# Patient Record
Sex: Female | Born: 1950
Health system: Southern US, Community
[De-identification: ages and names within clinical notes are randomized; demographics above are authoritative.]

## PROBLEM LIST (undated history)

## (undated) DIAGNOSIS — M8580 Other specified disorders of bone density and structure, unspecified site: Secondary | ICD-10-CM

## (undated) DIAGNOSIS — N393 Stress incontinence (female) (male): Secondary | ICD-10-CM

## (undated) DIAGNOSIS — Z973 Presence of spectacles and contact lenses: Secondary | ICD-10-CM

## (undated) DIAGNOSIS — L309 Dermatitis, unspecified: Secondary | ICD-10-CM

## (undated) DIAGNOSIS — N8110 Cystocele, unspecified: Secondary | ICD-10-CM

## (undated) DIAGNOSIS — K219 Gastro-esophageal reflux disease without esophagitis: Secondary | ICD-10-CM

## (undated) DIAGNOSIS — L719 Rosacea, unspecified: Secondary | ICD-10-CM

## (undated) DIAGNOSIS — E079 Disorder of thyroid, unspecified: Secondary | ICD-10-CM

## (undated) DIAGNOSIS — E039 Hypothyroidism, unspecified: Secondary | ICD-10-CM

## (undated) DIAGNOSIS — R413 Other amnesia: Secondary | ICD-10-CM

## (undated) DIAGNOSIS — E7800 Pure hypercholesterolemia, unspecified: Secondary | ICD-10-CM

## (undated) DIAGNOSIS — Z72820 Sleep deprivation: Secondary | ICD-10-CM

## (undated) DIAGNOSIS — Z8719 Personal history of other diseases of the digestive system: Secondary | ICD-10-CM

## (undated) DIAGNOSIS — F419 Anxiety disorder, unspecified: Secondary | ICD-10-CM

## (undated) DIAGNOSIS — T7840XA Allergy, unspecified, initial encounter: Secondary | ICD-10-CM

## (undated) DIAGNOSIS — G709 Myoneural disorder, unspecified: Secondary | ICD-10-CM

## (undated) DIAGNOSIS — H269 Unspecified cataract: Secondary | ICD-10-CM

## (undated) DIAGNOSIS — U071 COVID-19: Secondary | ICD-10-CM

## (undated) HISTORY — DX: Pure hypercholesterolemia, unspecified: E78.00

## (undated) HISTORY — DX: Sleep deprivation: Z72.820

## (undated) HISTORY — DX: Disorder of thyroid, unspecified: E07.9

## (undated) HISTORY — PX: BLADDER SUSPENSION: SHX72

## (undated) HISTORY — DX: Anxiety disorder, unspecified: F41.9

## (undated) HISTORY — DX: Other specified disorders of bone density and structure, unspecified site: M85.80

## (undated) HISTORY — DX: Allergy, unspecified, initial encounter: T78.40XA

## (undated) HISTORY — PX: TONSILLECTOMY: SUR1361

## (undated) HISTORY — DX: Gastro-esophageal reflux disease without esophagitis: K21.9

## (undated) HISTORY — PX: VAGINAL HYSTERECTOMY: SUR661

## (undated) HISTORY — PX: NECK SURGERY: SHX720

## (undated) HISTORY — DX: Other amnesia: R41.3

## (undated) HISTORY — PX: OTHER SURGICAL HISTORY: SHX169

## (undated) HISTORY — PX: ABDOMINAL HYSTERECTOMY: SHX81

## (undated) HISTORY — PX: OOPHORECTOMY: SHX86

## (undated) HISTORY — DX: Unspecified cataract: H26.9

## (undated) HISTORY — PX: CATARACT EXTRACTION: SUR2

## (undated) SURGERY — Surgical Case
Anesthesia: *Unknown

---
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
## 1998-06-11 ENCOUNTER — Ambulatory Visit (HOSPITAL_COMMUNITY): Admission: RE | Admit: 1998-06-11 | Discharge: 1998-06-11 | Payer: Self-pay | Admitting: Obstetrics and Gynecology

## 2000-04-03 ENCOUNTER — Encounter: Payer: Self-pay | Admitting: Emergency Medicine

## 2000-04-03 ENCOUNTER — Ambulatory Visit (HOSPITAL_COMMUNITY): Admission: RE | Admit: 2000-04-03 | Discharge: 2000-04-03 | Payer: Self-pay | Admitting: Emergency Medicine

## 2001-05-01 ENCOUNTER — Ambulatory Visit (HOSPITAL_COMMUNITY): Admission: RE | Admit: 2001-05-01 | Discharge: 2001-05-01 | Payer: Self-pay | Admitting: Emergency Medicine

## 2001-09-11 ENCOUNTER — Encounter: Admission: RE | Admit: 2001-09-11 | Discharge: 2001-09-11 | Payer: Self-pay | Admitting: Emergency Medicine

## 2001-09-11 ENCOUNTER — Encounter: Payer: Self-pay | Admitting: Emergency Medicine

## 2002-03-07 ENCOUNTER — Encounter: Payer: Self-pay | Admitting: Emergency Medicine

## 2002-03-07 ENCOUNTER — Ambulatory Visit (HOSPITAL_COMMUNITY): Admission: RE | Admit: 2002-03-07 | Discharge: 2002-03-07 | Payer: Self-pay | Admitting: Emergency Medicine

## 2002-03-28 ENCOUNTER — Ambulatory Visit (HOSPITAL_COMMUNITY): Admission: RE | Admit: 2002-03-28 | Discharge: 2002-03-28 | Payer: Self-pay | Admitting: Emergency Medicine

## 2002-03-28 ENCOUNTER — Encounter: Payer: Self-pay | Admitting: Emergency Medicine

## 2002-04-11 ENCOUNTER — Encounter: Payer: Self-pay | Admitting: Emergency Medicine

## 2002-04-11 ENCOUNTER — Encounter: Admission: RE | Admit: 2002-04-11 | Discharge: 2002-04-11 | Payer: Self-pay | Admitting: Emergency Medicine

## 2003-10-07 ENCOUNTER — Emergency Department (HOSPITAL_COMMUNITY): Admission: EM | Admit: 2003-10-07 | Discharge: 2003-10-07 | Payer: Self-pay | Admitting: Emergency Medicine

## 2003-11-12 ENCOUNTER — Encounter: Admission: RE | Admit: 2003-11-12 | Discharge: 2003-11-12 | Payer: Self-pay | Admitting: Emergency Medicine

## 2003-11-23 ENCOUNTER — Encounter: Admission: RE | Admit: 2003-11-23 | Discharge: 2003-11-23 | Payer: Self-pay | Admitting: Neurosurgery

## 2003-12-14 ENCOUNTER — Ambulatory Visit (HOSPITAL_COMMUNITY): Admission: RE | Admit: 2003-12-14 | Discharge: 2003-12-14 | Payer: Self-pay | Admitting: Gastroenterology

## 2003-12-14 ENCOUNTER — Encounter (INDEPENDENT_AMBULATORY_CARE_PROVIDER_SITE_OTHER): Payer: Self-pay | Admitting: Specialist

## 2005-04-26 ENCOUNTER — Encounter: Admission: RE | Admit: 2005-04-26 | Discharge: 2005-04-26 | Payer: Self-pay | Admitting: Emergency Medicine

## 2005-05-15 ENCOUNTER — Encounter: Admission: RE | Admit: 2005-05-15 | Discharge: 2005-05-15 | Payer: Self-pay | Admitting: Emergency Medicine

## 2006-08-10 ENCOUNTER — Encounter: Admission: RE | Admit: 2006-08-10 | Discharge: 2006-08-10 | Payer: Self-pay | Admitting: Emergency Medicine

## 2007-10-25 ENCOUNTER — Encounter: Admission: RE | Admit: 2007-10-25 | Discharge: 2007-10-25 | Payer: Self-pay | Admitting: Emergency Medicine

## 2008-01-17 ENCOUNTER — Ambulatory Visit (HOSPITAL_COMMUNITY): Admission: RE | Admit: 2008-01-17 | Discharge: 2008-01-17 | Payer: Self-pay | Admitting: Internal Medicine

## 2008-01-17 ENCOUNTER — Ambulatory Visit: Payer: Self-pay | Admitting: Surgery

## 2008-01-17 ENCOUNTER — Encounter (INDEPENDENT_AMBULATORY_CARE_PROVIDER_SITE_OTHER): Payer: Self-pay | Admitting: Internal Medicine

## 2008-03-13 ENCOUNTER — Encounter (INDEPENDENT_AMBULATORY_CARE_PROVIDER_SITE_OTHER): Payer: Self-pay | Admitting: *Deleted

## 2008-03-13 ENCOUNTER — Ambulatory Visit (HOSPITAL_COMMUNITY): Admission: RE | Admit: 2008-03-13 | Discharge: 2008-03-13 | Payer: Self-pay | Admitting: *Deleted

## 2008-10-16 ENCOUNTER — Ambulatory Visit (HOSPITAL_COMMUNITY): Admission: RE | Admit: 2008-10-16 | Discharge: 2008-10-16 | Payer: Self-pay | Admitting: *Deleted

## 2008-11-02 ENCOUNTER — Encounter: Admission: RE | Admit: 2008-11-02 | Discharge: 2008-11-02 | Payer: Self-pay | Admitting: Internal Medicine

## 2009-03-29 ENCOUNTER — Ambulatory Visit (HOSPITAL_COMMUNITY): Admission: RE | Admit: 2009-03-29 | Discharge: 2009-03-29 | Payer: Self-pay | Admitting: *Deleted

## 2009-06-29 ENCOUNTER — Ambulatory Visit (HOSPITAL_COMMUNITY): Admission: RE | Admit: 2009-06-29 | Discharge: 2009-06-29 | Payer: Self-pay | Admitting: Internal Medicine

## 2009-06-29 ENCOUNTER — Ambulatory Visit: Payer: Self-pay | Admitting: Vascular Surgery

## 2009-06-29 ENCOUNTER — Encounter (INDEPENDENT_AMBULATORY_CARE_PROVIDER_SITE_OTHER): Payer: Self-pay | Admitting: Internal Medicine

## 2010-01-20 ENCOUNTER — Emergency Department (HOSPITAL_COMMUNITY): Admission: EM | Admit: 2010-01-20 | Discharge: 2010-01-20 | Payer: Self-pay | Admitting: Emergency Medicine

## 2010-01-24 ENCOUNTER — Encounter: Admission: RE | Admit: 2010-01-24 | Discharge: 2010-01-24 | Payer: Self-pay | Admitting: Neurosurgery

## 2010-02-10 ENCOUNTER — Encounter: Admission: RE | Admit: 2010-02-10 | Discharge: 2010-02-10 | Payer: Self-pay | Admitting: Neurosurgery

## 2010-02-22 ENCOUNTER — Encounter: Admission: RE | Admit: 2010-02-22 | Discharge: 2010-02-22 | Payer: Self-pay | Admitting: Internal Medicine

## 2010-05-04 ENCOUNTER — Encounter: Admission: RE | Admit: 2010-05-04 | Discharge: 2010-05-04 | Payer: Self-pay | Admitting: Neurosurgery

## 2010-07-05 ENCOUNTER — Ambulatory Visit (HOSPITAL_COMMUNITY): Admission: RE | Admit: 2010-07-05 | Discharge: 2010-07-05 | Payer: Self-pay | Admitting: Neurosurgery

## 2010-07-19 ENCOUNTER — Encounter: Admission: RE | Admit: 2010-07-19 | Discharge: 2010-07-19 | Payer: Self-pay | Admitting: Neurosurgery

## 2010-08-08 ENCOUNTER — Ambulatory Visit (HOSPITAL_COMMUNITY): Admission: RE | Admit: 2010-08-08 | Discharge: 2010-08-08 | Payer: Self-pay | Admitting: Neurosurgery

## 2010-09-05 ENCOUNTER — Encounter: Admission: RE | Admit: 2010-09-05 | Discharge: 2010-09-14 | Payer: Self-pay | Admitting: Neurosurgery

## 2010-10-03 ENCOUNTER — Inpatient Hospital Stay (HOSPITAL_COMMUNITY)
Admission: RE | Admit: 2010-10-03 | Discharge: 2010-10-04 | Payer: Self-pay | Source: Home / Self Care | Admitting: Neurosurgery

## 2010-10-28 ENCOUNTER — Ambulatory Visit (HOSPITAL_COMMUNITY)
Admission: RE | Admit: 2010-10-28 | Discharge: 2010-10-28 | Payer: Self-pay | Source: Home / Self Care | Attending: Neurosurgery | Admitting: Neurosurgery

## 2010-11-27 ENCOUNTER — Encounter: Payer: Self-pay | Admitting: Internal Medicine

## 2010-12-27 ENCOUNTER — Ambulatory Visit (HOSPITAL_COMMUNITY)
Admission: RE | Admit: 2010-12-27 | Discharge: 2010-12-27 | Disposition: A | Discharge status: Home / Self Care | Payer: 59 | Source: Ambulatory Visit | Attending: Neurosurgery | Admitting: Neurosurgery

## 2010-12-27 ENCOUNTER — Other Ambulatory Visit (HOSPITAL_COMMUNITY): Payer: Self-pay | Admitting: Neurosurgery

## 2010-12-27 DIAGNOSIS — Z981 Arthrodesis status: Secondary | ICD-10-CM | POA: Insufficient documentation

## 2010-12-27 DIAGNOSIS — R52 Pain, unspecified: Secondary | ICD-10-CM

## 2010-12-27 DIAGNOSIS — M542 Cervicalgia: Secondary | ICD-10-CM | POA: Insufficient documentation

## 2011-01-17 LAB — CBC
HCT: 41.4 % (ref 36.0–46.0)
Hemoglobin: 13.7 g/dL (ref 12.0–15.0)
MCH: 30 pg (ref 26.0–34.0)
MCHC: 33.1 g/dL (ref 30.0–36.0)
MCV: 90.6 fL (ref 78.0–100.0)
Platelets: 285 10*3/uL (ref 150–400)
RBC: 4.57 MIL/uL (ref 3.87–5.11)
RDW: 13 % (ref 11.5–15.5)
WBC: 6.7 10*3/uL (ref 4.0–10.5)

## 2011-01-17 LAB — SURGICAL PCR SCREEN
MRSA, PCR: NEGATIVE
Staphylococcus aureus: NEGATIVE

## 2011-03-21 NOTE — Op Note (Signed)
Anne Alexander, Anne Alexander            ACCOUNT NO.:  [id number]   MEDICAL RECORD NO.:  [id number]          PATIENT TYPE:  AMB   LOCATION:  ENDO                         FACILITY:  [facility]   PHYSICIAN:  Georgiana Spinner, M.D.    DATE OF BIRTH:  06-13-1951   DATE OF PROCEDURE:  DATE OF DISCHARGE:                               OPERATIVE REPORT   PROCEDURE:  Upper endoscopy with biopsy.   INDICATIONS:  H. pylori gastritis.   ANESTHESIA:  Fentanyl 50 mcg, Versed 5 mg.   PROCEDURE:  With the patient mildly sedated in the left lateral  decubitus position, the Pentax videoscopic endoscope was inserted in the  mouth, passed under direct vision through the esophagus, which appeared  normal, into the stomach fundus, body, antrum appeared normal.  The  duodenal bulb showed some mild erythema, very patchy.  The second  portion of the duodenum appeared normal.  From this point the endoscope  was slowly withdrawn, taking circumferential views of the duodenal  mucosa until the endoscope had been pulled back into the stomach, placed  in retroflexion to view the stomach from below.  The endoscope was then  straightened and biopsies were taken from the antrum and fundus, placed  on an H. pylori rapid test called a CLOtest, and another biopsy was  taken separately to be sent for pathology as necessary.  The endoscope  was then withdrawn, taking circumferential views of the remaining  gastric and esophageal mucosa.  The patient's vital signs, pulse  oximeter remained stable.  The patient tolerated the procedure well  without apparent complication.   FINDINGS:  Essentially normal examination with mild erythema of the  duodenal bulb.   PLAN:  Await CLO biopsy report.  If negative, will not send the biopsied  tissue for culture           ______________________________  Georgiana Spinner, M.D.     GMO/MEDQ  D:  10/16/2008  T:  10/16/2008  Job:  [id number]

## 2011-03-21 NOTE — Op Note (Signed)
NAMELILLIS, NUTTLE            ACCOUNT NO.:  [id number]   MEDICAL RECORD NO.:  [id number]          PATIENT TYPE:  AMB   LOCATION:  ENDO                         FACILITY:  [facility]   PHYSICIAN:  Georgiana Spinner, M.D.    DATE OF BIRTH:  07/21/1951   DATE OF PROCEDURE:  03/29/2009  DATE OF DISCHARGE:                               OPERATIVE REPORT   PROCEDURE:  Colonoscopy.   INDICATIONS:  Colon polyps.   ANESTHESIA:  Fentanyl 75 mcg, Versed 7 mg.   PROCEDURE:  With the patient mildly sedated in the left lateral  decubitus position, the Pentax videoscopic colonoscope was inserted into  the rectum and passed under direct vision to the cecum identified by  ileocecal valve and appendiceal orifice both of which were photographed.  From this point the colonoscope was slowly withdrawn taking  circumferential views of colonic mucosa stopping to photograph  diverticula all the way until we reached the rectum which appeared  normal on direct and showed hemorrhoids on retroflexed view.  The  endoscope was straightened and withdrawn.  The patient's vital signs,  pulse oximeter remained stable.  The patient tolerated procedure well  without apparent complications.   FINDINGS:  Diverticulosis of sigmoid colon moderately severe internal  hemorrhoids and of note, lipoma in the ascending colon near this hepatic  flexure, photographed only.   PLAN:  Have patient follow-up with me in 5 years or as needed.           ______________________________  Georgiana Spinner, M.D.     GMO/MEDQ  D:  03/29/2009  T:  03/29/2009  Job:  [id number]

## 2011-03-21 NOTE — Op Note (Signed)
Anne Alexander, Anne Alexander            ACCOUNT NO.:  [id number]   MEDICAL RECORD NO.:  [id number]          PATIENT TYPE:  AMB   LOCATION:  ENDO                         FACILITY:  [facility]   PHYSICIAN:  Georgiana Spinner, M.D.    DATE OF BIRTH:  Aug 13, 1951   DATE OF PROCEDURE:  03/13/2008  DATE OF DISCHARGE:                               OPERATIVE REPORT   PROCEDURE:  Upper endoscopy.   INDICATIONS:  Upper gastrointestinal symptoms.   ANESTHESIA:  1. Fentanyl 25 mcg.  2. Versed 2.5 mg.   PROCEDURE:  With the patient mildly sedated in the left lateral  decubitus position, the Pentax videoscopic endoscope was inserted in the  mouth and passed under direct vision through the esophagus, which  appeared normal.  There was no evidence of esophagitis or Barrett's  seen.  We entered into the stomach.  Fundus, body, and antrum appeared  clear.  Duodenal bulb showed some minor changes of duodenitis which were  photographed and biopsied.  Second portion of the duodenum appeared  normal.  From this point, the endoscope was slowly withdrawn, taking  circumferential views of the duodenal mucosa, until the endoscope had  been pulled back into the stomach and placed in retroflexion to view the  stomach from below.  The endoscope was straightened and withdrawn,  taking circumferential views of the remaining gastric and esophageal  mucosa.  The patient's vital signs and pulse oximeter remained stable.  The patient tolerated the procedure well, without apparent  complications.   FINDINGS:  Mild duodenitis and incomplete wrap of the GE junction around  the endoscope, indicating laxity of the lower esophageal sphincter.   PLAN:  Await biopsy report.  The patient will call me for results and  follow up with me as an outpatient.  Of note, we also did a biopsy of  the antrum and body of the stomach, even though they appeared normal, to  rule out occult Helicobacter pylori.     ______________________________  Georgiana Spinner, M.D.     GMO/MEDQ  D:  03/13/2008  T:  03/13/2008  Job:  [id number]

## 2011-03-24 NOTE — Op Note (Signed)
NAME:  Anne Alexander, Anne Alexander                      ACCOUNT NO.:  [id number]   MEDICAL RECORD NO.:  [id number]                   PATIENT TYPE:  AMB   LOCATION:  ENDO                                 [facility]ACILITY:  [facility]   PHYSICIAN:  Danise Edge, M.D.                DATE OF BIRTH:  December 08, 1950   DATE OF PROCEDURE:  12/14/2003  DATE OF DISCHARGE:                                 OPERATIVE REPORT   PROCEDURE:  Colonoscopy and polypectomy.   INDICATIONS:  Anne Alexander is a 60 year old female, born Mar 31, 1951.  Anne Alexander has chronic functional type constipation.  She  underwent a flexible proctosigmoidoscopy performed by Dr. Leslee Home and a  small polyp was present at 20 cm from the anal verge.   ENDOSCOPIST:  Danise Edge, M.D.   PREMEDICATION:  Versed 7.5 mg, Demerol 50 mg.   DESCRIPTION OF PROCEDURE:  After obtaining informed consent, Anne Alexander  was placed in the left lateral decubitus position.  I administered  intravenous Demerol and intravenous Versed to achieve conscious sedation for  the procedure.  The patient's blood pressure, oxygen saturation and cardiac  rhythm were monitored throughout the procedure and documented in the medical  record.   Anal inspection was normal.  Digital rectal exam was normal.  The Olympus  adjustable pediatric colonoscope was introduced into the rectum and advanced  to the cecum.  Colonic preparation for the exam today was excellent.  Mrs.  Anne Alexander received a combination of MiraLax mixed in [place] Ade.  Rectum:  Normal.  Sigmoid colon and descending colon:  At 20 cm from the anal verge, two 2 mm  sessile polyps were removed with the electrocautery snare and submitted for  pathologic interpretation.  Extensive left colonic diverticulosis.  Splenic flexure:  Normal.  Transverse colon:  Normal.  Hepatic flexure:  Normal.  Ascending colon:  Normal.  Cecum and ileocecal valve:  Normal.   ASSESSMENT:  1. From the distal sigmoid  colon, two small polyps were removed with the     electrocautery snare.  2. Extensive left colonic diverticulosis.   RECOMMENDATIONS:  Repeat colonoscopy in five years if polyps return  neoplastic pathologically.                                               Danise Edge, M.D.    MJ/MEDQ  D:  12/14/2003  T:  12/14/2003  Job:  [id number]   cc:   Reuben Likes, M.D.  [address]  [place]  [place] [address]  Fax: [phone]

## 2011-04-27 ENCOUNTER — Ambulatory Visit (HOSPITAL_COMMUNITY)
Admission: RE | Admit: 2011-04-27 | Discharge: 2011-04-27 | Disposition: A | Discharge status: Home / Self Care | Payer: 59 | Source: Ambulatory Visit | Attending: Neurosurgery | Admitting: Neurosurgery

## 2011-04-27 ENCOUNTER — Other Ambulatory Visit (HOSPITAL_COMMUNITY): Payer: Self-pay | Admitting: Neurosurgery

## 2011-04-27 DIAGNOSIS — M542 Cervicalgia: Secondary | ICD-10-CM | POA: Insufficient documentation

## 2011-04-27 DIAGNOSIS — R52 Pain, unspecified: Secondary | ICD-10-CM

## 2011-07-13 ENCOUNTER — Ambulatory Visit (HOSPITAL_COMMUNITY)
Admission: RE | Admit: 2011-07-13 | Discharge: 2011-07-13 | Disposition: A | Discharge status: Home / Self Care | Payer: 59 | Source: Ambulatory Visit | Attending: Neurosurgery | Admitting: Neurosurgery

## 2011-07-13 ENCOUNTER — Other Ambulatory Visit (HOSPITAL_COMMUNITY): Payer: Self-pay | Admitting: Neurosurgery

## 2011-07-13 DIAGNOSIS — R52 Pain, unspecified: Secondary | ICD-10-CM

## 2011-07-13 DIAGNOSIS — M542 Cervicalgia: Secondary | ICD-10-CM | POA: Insufficient documentation

## 2011-07-13 DIAGNOSIS — M2569 Stiffness of other specified joint, not elsewhere classified: Secondary | ICD-10-CM | POA: Insufficient documentation

## 2011-08-11 LAB — MISCELLANEOUS TEST

## 2011-08-11 LAB — CLOTEST (H. PYLORI), BIOPSY: Helicobacter screen: POSITIVE — AB

## 2011-10-03 ENCOUNTER — Other Ambulatory Visit (HOSPITAL_COMMUNITY): Payer: Self-pay | Admitting: Neurosurgery

## 2011-10-03 ENCOUNTER — Ambulatory Visit (HOSPITAL_COMMUNITY)
Admission: RE | Admit: 2011-10-03 | Discharge: 2011-10-03 | Disposition: A | Discharge status: Home / Self Care | Payer: 59 | Source: Ambulatory Visit | Attending: Neurosurgery | Admitting: Neurosurgery

## 2011-10-03 DIAGNOSIS — M542 Cervicalgia: Secondary | ICD-10-CM | POA: Insufficient documentation

## 2011-10-03 DIAGNOSIS — R52 Pain, unspecified: Secondary | ICD-10-CM

## 2011-10-10 ENCOUNTER — Other Ambulatory Visit: Payer: Self-pay | Admitting: Neurosurgery

## 2011-10-10 DIAGNOSIS — M549 Dorsalgia, unspecified: Secondary | ICD-10-CM

## 2011-10-12 ENCOUNTER — Ambulatory Visit
Admission: RE | Admit: 2011-10-12 | Discharge: 2011-10-12 | Disposition: A | Discharge status: Home / Self Care | Payer: 59 | Source: Ambulatory Visit | Attending: Neurosurgery | Admitting: Neurosurgery

## 2011-10-12 DIAGNOSIS — M549 Dorsalgia, unspecified: Secondary | ICD-10-CM

## 2011-10-12 MED ORDER — IOHEXOL 180 MG/ML  SOLN
1.0000 mL | Freq: Once | INTRAMUSCULAR | Status: AC | PRN
  Administered 2011-10-12: 1 mL via EPIDURAL

## 2011-10-12 MED ORDER — METHYLPREDNISOLONE ACETATE 40 MG/ML INJ SUSP (RADIOLOG
120.0000 mg | Freq: Once | INTRAMUSCULAR | Status: AC
  Administered 2011-10-12: 120 mg via EPIDURAL

## 2011-11-08 ENCOUNTER — Other Ambulatory Visit: Payer: Self-pay | Admitting: Internal Medicine

## 2011-11-08 DIAGNOSIS — Z1231 Encounter for screening mammogram for malignant neoplasm of breast: Secondary | ICD-10-CM

## 2011-12-12 ENCOUNTER — Ambulatory Visit
Admission: RE | Admit: 2011-12-12 | Discharge: 2011-12-12 | Disposition: A | Discharge status: Home / Self Care | Payer: 59 | Source: Ambulatory Visit | Attending: Internal Medicine | Admitting: Internal Medicine

## 2011-12-12 DIAGNOSIS — Z1231 Encounter for screening mammogram for malignant neoplasm of breast: Secondary | ICD-10-CM

## 2012-11-21 ENCOUNTER — Other Ambulatory Visit: Payer: Self-pay | Admitting: Internal Medicine

## 2012-11-21 DIAGNOSIS — Z1231 Encounter for screening mammogram for malignant neoplasm of breast: Secondary | ICD-10-CM

## 2013-01-14 ENCOUNTER — Ambulatory Visit
Admission: RE | Admit: 2013-01-14 | Discharge: 2013-01-14 | Disposition: A | Discharge status: Home / Self Care | Payer: 59 | Source: Ambulatory Visit | Attending: Internal Medicine | Admitting: Internal Medicine

## 2013-01-14 ENCOUNTER — Other Ambulatory Visit: Payer: Self-pay | Admitting: Internal Medicine

## 2013-01-14 ENCOUNTER — Ambulatory Visit: Payer: 59

## 2013-02-17 ENCOUNTER — Other Ambulatory Visit (HOSPITAL_COMMUNITY): Payer: Self-pay | Admitting: Neurosurgery

## 2013-02-17 ENCOUNTER — Ambulatory Visit (HOSPITAL_COMMUNITY)
Admission: RE | Admit: 2013-02-17 | Discharge: 2013-02-17 | Disposition: A | Discharge status: Home / Self Care | Payer: 59 | Source: Ambulatory Visit | Attending: Neurosurgery | Admitting: Neurosurgery

## 2013-02-17 DIAGNOSIS — M51379 Other intervertebral disc degeneration, lumbosacral region without mention of lumbar back pain or lower extremity pain: Secondary | ICD-10-CM | POA: Insufficient documentation

## 2013-02-17 DIAGNOSIS — M5137 Other intervertebral disc degeneration, lumbosacral region: Secondary | ICD-10-CM | POA: Insufficient documentation

## 2013-02-17 DIAGNOSIS — M5126 Other intervertebral disc displacement, lumbar region: Secondary | ICD-10-CM

## 2013-02-17 DIAGNOSIS — M542 Cervicalgia: Secondary | ICD-10-CM | POA: Insufficient documentation

## 2013-02-20 ENCOUNTER — Other Ambulatory Visit (HOSPITAL_COMMUNITY): Payer: Self-pay | Admitting: Neurosurgery

## 2013-02-20 DIAGNOSIS — IMO0002 Reserved for concepts with insufficient information to code with codable children: Secondary | ICD-10-CM

## 2013-02-20 DIAGNOSIS — M542 Cervicalgia: Secondary | ICD-10-CM

## 2013-02-26 ENCOUNTER — Ambulatory Visit (HOSPITAL_COMMUNITY)
Admission: RE | Admit: 2013-02-26 | Discharge: 2013-02-26 | Disposition: A | Discharge status: Home / Self Care | Payer: 59 | Source: Ambulatory Visit | Attending: Neurosurgery | Admitting: Neurosurgery

## 2013-02-26 DIAGNOSIS — M47817 Spondylosis without myelopathy or radiculopathy, lumbosacral region: Secondary | ICD-10-CM | POA: Insufficient documentation

## 2013-02-26 DIAGNOSIS — M5126 Other intervertebral disc displacement, lumbar region: Secondary | ICD-10-CM | POA: Insufficient documentation

## 2013-02-26 DIAGNOSIS — IMO0002 Reserved for concepts with insufficient information to code with codable children: Secondary | ICD-10-CM

## 2013-02-26 DIAGNOSIS — M5030 Other cervical disc degeneration, unspecified cervical region: Secondary | ICD-10-CM | POA: Insufficient documentation

## 2013-02-26 DIAGNOSIS — E049 Nontoxic goiter, unspecified: Secondary | ICD-10-CM | POA: Insufficient documentation

## 2013-02-26 DIAGNOSIS — M542 Cervicalgia: Secondary | ICD-10-CM

## 2013-02-26 DIAGNOSIS — M5020 Other cervical disc displacement, unspecified cervical region: Secondary | ICD-10-CM | POA: Insufficient documentation

## 2013-03-17 ENCOUNTER — Other Ambulatory Visit: Payer: Self-pay | Admitting: Internal Medicine

## 2013-03-17 DIAGNOSIS — E041 Nontoxic single thyroid nodule: Secondary | ICD-10-CM

## 2013-03-18 ENCOUNTER — Other Ambulatory Visit: Payer: Self-pay | Admitting: Neurosurgery

## 2013-03-18 DIAGNOSIS — M549 Dorsalgia, unspecified: Secondary | ICD-10-CM

## 2013-03-24 ENCOUNTER — Ambulatory Visit
Admission: RE | Admit: 2013-03-24 | Discharge: 2013-03-24 | Disposition: A | Discharge status: Home / Self Care | Payer: 59 | Source: Ambulatory Visit | Attending: Neurosurgery | Admitting: Neurosurgery

## 2013-03-24 ENCOUNTER — Other Ambulatory Visit: Payer: Self-pay | Admitting: Neurosurgery

## 2013-03-24 ENCOUNTER — Ambulatory Visit
Admission: RE | Admit: 2013-03-24 | Discharge: 2013-03-24 | Disposition: A | Discharge status: Home / Self Care | Payer: 59 | Source: Ambulatory Visit | Attending: Internal Medicine | Admitting: Internal Medicine

## 2013-03-24 DIAGNOSIS — M549 Dorsalgia, unspecified: Secondary | ICD-10-CM

## 2013-03-24 DIAGNOSIS — E041 Nontoxic single thyroid nodule: Secondary | ICD-10-CM

## 2013-03-24 MED ORDER — IOHEXOL 180 MG/ML  SOLN
1.0000 mL | Freq: Once | INTRAMUSCULAR | Status: AC | PRN
  Administered 2013-03-24: 1 mL via EPIDURAL

## 2013-03-24 MED ORDER — METHYLPREDNISOLONE ACETATE 40 MG/ML INJ SUSP (RADIOLOG
120.0000 mg | Freq: Once | INTRAMUSCULAR | Status: AC
  Administered 2013-03-24: 120 mg via EPIDURAL

## 2013-03-25 ENCOUNTER — Other Ambulatory Visit: Payer: Self-pay | Admitting: Internal Medicine

## 2013-03-25 DIAGNOSIS — E041 Nontoxic single thyroid nodule: Secondary | ICD-10-CM

## 2013-04-02 ENCOUNTER — Other Ambulatory Visit (HOSPITAL_COMMUNITY)
Admission: RE | Admit: 2013-04-02 | Discharge: 2013-04-02 | Disposition: A | Discharge status: Home / Self Care | Payer: 59 | Source: Ambulatory Visit | Attending: Interventional Radiology | Admitting: Interventional Radiology

## 2013-04-02 ENCOUNTER — Ambulatory Visit
Admission: RE | Admit: 2013-04-02 | Discharge: 2013-04-02 | Disposition: A | Discharge status: Home / Self Care | Payer: 59 | Source: Ambulatory Visit | Attending: Internal Medicine | Admitting: Internal Medicine

## 2013-04-02 DIAGNOSIS — E049 Nontoxic goiter, unspecified: Secondary | ICD-10-CM | POA: Insufficient documentation

## 2013-04-02 DIAGNOSIS — E041 Nontoxic single thyroid nodule: Secondary | ICD-10-CM

## 2013-09-12 ENCOUNTER — Other Ambulatory Visit: Payer: Self-pay | Admitting: Endocrinology

## 2013-09-12 DIAGNOSIS — E041 Nontoxic single thyroid nodule: Secondary | ICD-10-CM

## 2014-03-09 ENCOUNTER — Ambulatory Visit
Admission: RE | Admit: 2014-03-09 | Discharge: 2014-03-09 | Disposition: A | Discharge status: Home / Self Care | Payer: 59 | Source: Ambulatory Visit | Attending: Endocrinology | Admitting: Endocrinology

## 2014-03-09 DIAGNOSIS — E041 Nontoxic single thyroid nodule: Secondary | ICD-10-CM

## 2014-03-16 ENCOUNTER — Other Ambulatory Visit: Payer: 59

## 2014-08-07 ENCOUNTER — Other Ambulatory Visit: Payer: Self-pay

## 2014-08-07 DIAGNOSIS — Z1239 Encounter for other screening for malignant neoplasm of breast: Secondary | ICD-10-CM

## 2014-08-10 ENCOUNTER — Encounter (INDEPENDENT_AMBULATORY_CARE_PROVIDER_SITE_OTHER): Payer: Self-pay

## 2014-08-10 ENCOUNTER — Ambulatory Visit
Admission: RE | Admit: 2014-08-10 | Discharge: 2014-08-10 | Disposition: A | Discharge status: Home / Self Care | Payer: 59 | Source: Ambulatory Visit

## 2014-08-10 DIAGNOSIS — Z1239 Encounter for other screening for malignant neoplasm of breast: Secondary | ICD-10-CM

## 2014-08-14 ENCOUNTER — Ambulatory Visit (INDEPENDENT_AMBULATORY_CARE_PROVIDER_SITE_OTHER): Payer: 59 | Admitting: Neurology

## 2014-08-14 ENCOUNTER — Encounter: Payer: Self-pay | Admitting: Neurology

## 2014-08-14 VITALS — BP 132/79 | HR 72 | Ht 65.0 in | Wt 206.0 lb

## 2014-08-14 DIAGNOSIS — R413 Other amnesia: Secondary | ICD-10-CM

## 2014-08-14 NOTE — Progress Notes (Signed)
PATIENT: Anne Alexander DOB: [birth year]  HISTORICAL  Anne Alexander is a 63 year old right-handed Caucasian female, referred by her primary care physician Dr. Wylene Simmerisovec, for evaluation of memory complaints,  She had a past medical history of obesity, hypothyroidism, on supplement, she has significant family history of dementia, her mother at age 61 has advanced dementia, his maternal grandfather, and his siblings all suffered Alzheimer's disease.  Patient had 1 years of college, currently works at [employer], review credit card history, which required close attention to details, she denies difficulty handling her job, but oftentimes she has to stop and think  Family also noticed over the past couple years, she has gradual onset word finding difficulties, tends to misplace things, but she is still very active at home, go to work without much difficulty, she is also the main caregiver of her mother, who lives next door to her, her mother has incontinence, could not carry on prolonged conversation anymore.  She has a very sedentary lifestyle, does not exercise regularly  In addition, she complains of chronic insomnia, difficulty falling to sleep, and staying asleep.  Laboratory October second 2015, showed normal CMP, glucose 97, normal CBC, elevated cholesterol 214, LDL 135, low TSH of 0.35, normal free T4-1.2.  REVIEW OF SYSTEMS: Full 14 system review of systems performed and notable only for snoring, incontinence, easy bruising, memory loss, confusion, headaches, slurred speech, insomnia, snoring ALLERGIES: Allergies  Allergen Reactions  . Penicillins Rash    HOME MEDICATIONS: No current outpatient prescriptions on file prior to visit.   No current facility-administered medications on file prior to visit.    PAST MEDICAL HISTORY: Past Medical History  Diagnosis Date  . Memory change     PAST SURGICAL HISTORY: Past Surgical History  Procedure Laterality Date  .  Neck surgery    . Abdominal hysterectomy      FAMILY HISTORY: No family history on file.  SOCIAL HISTORY:  History   Social History  . Marital Status: Married    Spouse Name: Nedra HaiLee    Number of Children: 1  . Years of Education: 13   Occupational History  .  [employer]   Social History Main Topics  . Smoking status: Former Smoker -- 1.00 packs/day for 20 years    Types: Cigarettes    Quit date: 03/24/2006  . Smokeless tobacco: Never Used  . Alcohol Use: Not on file  . Drug Use: Not on file  . Sexual Activity: Not on file   Other Topics Concern  . Not on file   Social History Narrative   Patient lives at home with her husband and works full time at [employer].    Education one year of college.     PHYSICAL EXAM   Filed Vitals:   08/14/14 0801  BP: 132/79  Pulse: 72  Height: 5\' 5"  (1.651 m)  Weight: 206 lb (93.441 kg)    Not recorded    Body mass index is 34.28 kg/(m^2).   Generalized: In no acute distress  Neck: Supple, no carotid bruits   Cardiac: Regular rate rhythm  Pulmonary: Clear to auscultation bilaterally  Musculoskeletal: No deformity  Neurological examination  Mentation: Alert oriented to time, place, history taking, and causual conversation, Mini-Mental Status Examination 29 out of 30, she missed 1 out of 3 recalls.  Cranial nerve II-XII: Pupils were equal round reactive to light. Extraocular movements were full.  Visual field were full on confrontational test. Bilateral fundi were  sharp.  Facial sensation and strength were normal. Hearing was intact to finger rubbing bilaterally. Uvula tongue midline.  Head turning and shoulder shrug and were normal and symmetric.Tongue protrusion into cheek strength was normal.  Motor: Normal tone, bulk and strength.  Sensory: Intact to fine touch, pinprick, preserved vibratory sensation, and proprioception at toes.  Coordination: Normal finger to nose, heel-to-shin bilaterally there was no  truncal ataxia  Gait: Rising up from seated position without assistance, normal stance, without trunk ataxia, moderate stride, good arm swing, smooth turning, able to perform tiptoe, and heel walking without difficulty.   Romberg signs: Negative  Deep tendon reflexes: Brachioradialis 2/2, biceps 2/2, triceps 2/2, patellar 2/2, Achilles 2/2, plantar responses were flexor bilaterally.   DIAGNOSTIC DATA (LABS, IMAGING, TESTING) - I reviewed patient records, labs, notes, testing and imaging myself where available.  Lab Results  Component Value Date   WBC 6.7 09/30/2010   HGB 13.7 09/30/2010   HCT 41.4 09/30/2010   MCV 90.6 09/30/2010   PLT 285 09/30/2010   ASSESSMENT AND PLAN  Anne Alexander is a 63 y.o. female with significant family history of Alzheimer's disease, her mother, maternal grandfather, and his siblings all suffered Alzheimer's disease, patient's noise concern about her word finding difficulties, tendency to misplace things, but there was no limitation in her job, and daily activity. Mini-Mental Status Examination is 29 out of 30  1. With her strong family history she certainly has higher chance of developing central nervous system degenerative disorder, but she is highly functional now 2. Laboratory evaluations, including B12, 3. MRI of the brain to rule out structural lesion 4. I will call her of all results, 5, I also encouraged her to continue moderate daily exercise.    Levert FeinsteinYijun Jamari Moten, M.D. Ph.D.  [facility] [address], [address] [place], [place] [id number] [phone]

## 2014-08-15 LAB — C-REACTIVE PROTEIN: CRP: 3 mg/L (ref 0.0–4.9)

## 2014-08-15 LAB — VITAMIN B12: VITAMIN B 12: 378 pg/mL (ref 211–946)

## 2014-08-15 LAB — FOLATE: FOLATE: 16.2 ng/mL (ref >3.0)

## 2014-08-15 LAB — SEDIMENTATION RATE: SED RATE: 2 mm/h (ref 0–40)

## 2014-08-15 LAB — ANA: Anti Nuclear Antibody(ANA): NEGATIVE

## 2014-08-19 ENCOUNTER — Ambulatory Visit (INDEPENDENT_AMBULATORY_CARE_PROVIDER_SITE_OTHER): Payer: 59

## 2014-08-19 DIAGNOSIS — R413 Other amnesia: Secondary | ICD-10-CM

## 2014-08-25 NOTE — Progress Notes (Signed)
Quick Note:  Called and left patient a message to call office for MRI results. ______

## 2015-10-07 ENCOUNTER — Other Ambulatory Visit: Payer: Self-pay

## 2015-10-07 DIAGNOSIS — Z1231 Encounter for screening mammogram for malignant neoplasm of breast: Secondary | ICD-10-CM

## 2015-11-05 ENCOUNTER — Ambulatory Visit
Admission: RE | Admit: 2015-11-05 | Discharge: 2015-11-05 | Disposition: A | Discharge status: Home / Self Care | Payer: 59 | Source: Ambulatory Visit

## 2015-11-05 DIAGNOSIS — Z1231 Encounter for screening mammogram for malignant neoplasm of breast: Secondary | ICD-10-CM

## 2015-11-09 MED FILL — DICLOFENAC SOD EC 50 MG TAB: 50 | 90 days supply | Qty: 180 | Fill #1

## 2015-11-22 DIAGNOSIS — R300 Dysuria: Secondary | ICD-10-CM | POA: Diagnosis not present

## 2015-11-22 DIAGNOSIS — N390 Urinary tract infection, site not specified: Secondary | ICD-10-CM | POA: Diagnosis not present

## 2015-12-31 MED FILL — SYNTHROID 75 MCG TABLET: 75 | 90 days supply | Qty: 90 | Fill #1

## 2016-02-09 MED FILL — DICLOFENAC SOD EC 50 MG TAB: 50 | 90 days supply | Qty: 180 | Fill #2

## 2016-03-03 ENCOUNTER — Other Ambulatory Visit: Payer: Self-pay | Admitting: Internal Medicine

## 2016-03-03 DIAGNOSIS — M1990 Unspecified osteoarthritis, unspecified site: Secondary | ICD-10-CM | POA: Diagnosis not present

## 2016-03-03 DIAGNOSIS — E042 Nontoxic multinodular goiter: Secondary | ICD-10-CM

## 2016-03-03 DIAGNOSIS — E7800 Pure hypercholesterolemia, unspecified: Secondary | ICD-10-CM | POA: Diagnosis not present

## 2016-03-03 DIAGNOSIS — Z6830 Body mass index (BMI) 30.0-30.9, adult: Secondary | ICD-10-CM | POA: Diagnosis not present

## 2016-03-03 DIAGNOSIS — E038 Other specified hypothyroidism: Secondary | ICD-10-CM | POA: Diagnosis not present

## 2016-03-03 DIAGNOSIS — Z1389 Encounter for screening for other disorder: Secondary | ICD-10-CM | POA: Diagnosis not present

## 2016-03-03 DIAGNOSIS — E668 Other obesity: Secondary | ICD-10-CM | POA: Diagnosis not present

## 2016-03-03 DIAGNOSIS — G4700 Insomnia, unspecified: Secondary | ICD-10-CM | POA: Diagnosis not present

## 2016-03-03 DIAGNOSIS — K219 Gastro-esophageal reflux disease without esophagitis: Secondary | ICD-10-CM | POA: Diagnosis not present

## 2016-03-03 DIAGNOSIS — E039 Hypothyroidism, unspecified: Secondary | ICD-10-CM

## 2016-03-10 ENCOUNTER — Ambulatory Visit
Admission: RE | Admit: 2016-03-10 | Discharge: 2016-03-10 | Disposition: A | Discharge status: Home / Self Care | Payer: 59 | Source: Ambulatory Visit | Attending: Internal Medicine | Admitting: Internal Medicine

## 2016-03-10 DIAGNOSIS — E042 Nontoxic multinodular goiter: Secondary | ICD-10-CM | POA: Diagnosis not present

## 2016-03-10 DIAGNOSIS — E039 Hypothyroidism, unspecified: Secondary | ICD-10-CM

## 2016-03-17 DIAGNOSIS — H5203 Hypermetropia, bilateral: Secondary | ICD-10-CM | POA: Diagnosis not present

## 2016-03-20 MED FILL — BELSOMRA 20 MG TABLET: 20 | 30 days supply | Qty: 30 | Fill #0

## 2016-03-24 MED FILL — SYNTHROID 75 MCG TABLET: 75 | 90 days supply | Qty: 90 | Fill #2

## 2016-04-12 MED FILL — BELSOMRA 20 MG TABLET: 20 | 30 days supply | Qty: 30 | Fill #1

## 2016-05-15 MED FILL — BELSOMRA 20 MG TABLET: 20 | 30 days supply | Qty: 30 | Fill #2

## 2016-05-15 MED FILL — DICLOFENAC SOD EC 50 MG TAB: 50 | 90 days supply | Qty: 180 | Fill #3

## 2016-06-15 MED FILL — BELSOMRA 20 MG TABLET: 20 | 30 days supply | Qty: 30 | Fill #0

## 2016-06-26 MED FILL — SYNTHROID 75 MCG TABLET: 75 | 90 days supply | Qty: 90 | Fill #3

## 2016-07-13 MED FILL — BELSOMRA 20 MG TABLET: 20 | 30 days supply | Qty: 30 | Fill #1

## 2016-08-10 MED FILL — DICLOFENAC SOD EC 50 MG TAB: 50 | 90 days supply | Qty: 180 | Fill #0

## 2016-09-21 MED FILL — SYNTHROID 75 MCG TABLET: 75 | 90 days supply | Qty: 90 | Fill #0

## 2016-10-23 DIAGNOSIS — E038 Other specified hypothyroidism: Secondary | ICD-10-CM | POA: Diagnosis not present

## 2016-10-23 DIAGNOSIS — Z0000 Encounter for general adult medical examination without abnormal findings: Secondary | ICD-10-CM | POA: Diagnosis not present

## 2016-10-24 ENCOUNTER — Other Ambulatory Visit: Payer: Self-pay | Admitting: Internal Medicine

## 2016-10-24 DIAGNOSIS — Z1231 Encounter for screening mammogram for malignant neoplasm of breast: Secondary | ICD-10-CM

## 2016-10-25 DIAGNOSIS — E042 Nontoxic multinodular goiter: Secondary | ICD-10-CM | POA: Diagnosis not present

## 2016-10-25 DIAGNOSIS — M1990 Unspecified osteoarthritis, unspecified site: Secondary | ICD-10-CM | POA: Diagnosis not present

## 2016-10-25 DIAGNOSIS — E038 Other specified hypothyroidism: Secondary | ICD-10-CM | POA: Diagnosis not present

## 2016-10-25 DIAGNOSIS — K219 Gastro-esophageal reflux disease without esophagitis: Secondary | ICD-10-CM | POA: Diagnosis not present

## 2016-10-25 DIAGNOSIS — E7800 Pure hypercholesterolemia, unspecified: Secondary | ICD-10-CM | POA: Diagnosis not present

## 2016-10-25 DIAGNOSIS — E668 Other obesity: Secondary | ICD-10-CM | POA: Diagnosis not present

## 2016-10-25 DIAGNOSIS — G4700 Insomnia, unspecified: Secondary | ICD-10-CM | POA: Diagnosis not present

## 2016-10-25 DIAGNOSIS — Z1389 Encounter for screening for other disorder: Secondary | ICD-10-CM | POA: Diagnosis not present

## 2016-10-25 DIAGNOSIS — N393 Stress incontinence (female) (male): Secondary | ICD-10-CM | POA: Diagnosis not present

## 2016-10-25 DIAGNOSIS — Z0000 Encounter for general adult medical examination without abnormal findings: Secondary | ICD-10-CM | POA: Diagnosis not present

## 2016-10-25 MED FILL — NEO/POLY/DEXAMET EYE OINT: 3.5-10000-0 | 7 days supply | Qty: 4 | Fill #0

## 2016-10-31 MED FILL — DICLOFENAC SOD EC 50 MG TAB: 50 | 90 days supply | Qty: 180 | Fill #1

## 2016-11-01 DIAGNOSIS — Z1212 Encounter for screening for malignant neoplasm of rectum: Secondary | ICD-10-CM | POA: Diagnosis not present

## 2016-11-01 MED FILL — BELSOMRA 20 MG TABLET: 20 | 30 days supply | Qty: 30 | Fill #2

## 2016-11-20 ENCOUNTER — Ambulatory Visit: Payer: 59

## 2016-11-23 ENCOUNTER — Ambulatory Visit: Payer: 59

## 2016-11-28 DIAGNOSIS — L03113 Cellulitis of right upper limb: Secondary | ICD-10-CM | POA: Diagnosis not present

## 2016-11-28 DIAGNOSIS — Z6834 Body mass index (BMI) 34.0-34.9, adult: Secondary | ICD-10-CM | POA: Diagnosis not present

## 2016-11-28 MED FILL — DOXYCYCLINE HYCLATE 100 MG: 100 | 7 days supply | Qty: 14 | Fill #0

## 2016-12-08 ENCOUNTER — Ambulatory Visit: Payer: 59

## 2016-12-08 MED FILL — AZITHROMYCIN 250 MG TABLET: 250 | 5 days supply | Qty: 6 | Fill #0

## 2016-12-11 MED FILL — BELSOMRA 20 MG TABLET: 20 | 30 days supply | Qty: 30 | Fill #3

## 2016-12-18 MED FILL — SYNTHROID 75 MCG TABLET: 75 | 90 days supply | Qty: 90 | Fill #1

## 2016-12-25 ENCOUNTER — Ambulatory Visit
Admission: RE | Admit: 2016-12-25 | Discharge: 2016-12-25 | Disposition: A | Discharge status: Home / Self Care | Payer: 59 | Source: Ambulatory Visit | Attending: Internal Medicine | Admitting: Internal Medicine

## 2016-12-25 ENCOUNTER — Encounter: Payer: Self-pay | Admitting: Neurology

## 2016-12-25 ENCOUNTER — Ambulatory Visit (INDEPENDENT_AMBULATORY_CARE_PROVIDER_SITE_OTHER): Payer: 59 | Admitting: Neurology

## 2016-12-25 VITALS — BP 127/74 | HR 61 | Resp 16 | Ht 65.0 in | Wt 200.0 lb

## 2016-12-25 DIAGNOSIS — G4730 Sleep apnea, unspecified: Secondary | ICD-10-CM | POA: Diagnosis not present

## 2016-12-25 DIAGNOSIS — Z1231 Encounter for screening mammogram for malignant neoplasm of breast: Secondary | ICD-10-CM | POA: Diagnosis not present

## 2016-12-25 DIAGNOSIS — G4700 Insomnia, unspecified: Secondary | ICD-10-CM | POA: Diagnosis not present

## 2016-12-25 DIAGNOSIS — R0683 Snoring: Secondary | ICD-10-CM | POA: Diagnosis not present

## 2016-12-25 DIAGNOSIS — G478 Other sleep disorders: Secondary | ICD-10-CM | POA: Diagnosis not present

## 2016-12-25 NOTE — Progress Notes (Signed)
SLEEP MEDICINE CLINIC   Provider:  Melvyn Novas, M D  Referring Provider: Gaspar Garbe, MD Primary Care Physician:  Gaspar Garbe, MD  Chief Complaint  Patient presents with  . Sleep Consult    Rm 11. Patient states that she has trouble staying asleep. Sleeps well in a recliner, not as well in a bed, head fogginess, snores, rarely takes a nap.      HPI:  Anne Alexander is a 66 y.o. female , seen here as a referral  from Dr. Wylene Simmer for a sleep evaluation,   Chief complaint according to patient : " I placed the recliner in my bedroom, take Benadryl and Belsomra prn. and watch TV to fall asleep."   Anne Alexander reports that when she was newly wed she would  promptly started talking the moment she retired to the bedroom. Her husband wanted to sleep and she wouldn't let up, trying to initiate a conversation. She reports that her mind has always worked this way for now over 30 years, and that she is struggling to stay asleep and keep her mind at rest. She has even noticed this distractibility and intrusion of thoughts at work. She is usually an early riser and early at work, by choice. In the past he sometimes struggled with her ability to operate a motor vehicle feeling that she didn't get enough rest.   Sleep habits are as follows: She is early retreating to the bedroom at about 8 or 8:30 PM and usually aims to sleep by about 9 PM, she  "needs " a TV in the bedroom for sound, set on a sleep timer for 90 minutes.  The Tv hleps her to overcome her own thoughts, takes her mind off.  She frequently wakes up at 1, 2 or 3 AM and has great difficulties going back to sleep at that time. Sleeping in a recliner has somewhat helped but not eliminated her insomnia. She usually goes to the bathroom with the very first arousal from sleep, but it is usually not the urge to urinate which woke her. At that time the TV is no longer running and she has made multiple attempts to go back to the  bed proper instead of the recliner but feels that she cannot go back to sleep and not stay asleep. Her bed is usually in disarray , she is tossing and turning very restless. The bedroom is cool at 92F, and later in the night quiet and dark. She feels in the morning defeated she gets up because it is not sensible to just stay in bed, but she's not refreshed or restored, would wish for another hour of sleep. Usually she will get between 4 and 6 hours of nocturnal sleep. On weekends she sometimes takes a nap unscheduled while watching TV, but usually these are 10 or 15 minute duration.  Sleep medical history and family sleep history: Insomnia- 15 years - progressing. Early bed time, early riser. Anterior cervical neck fusion,, thyroid disease supplemented on Synthroid, no history of TBI, concussion or contusion. Her sister and 2 brothers are not affected by insomnia. Her older brother has vascular dementia, CAD , DM. Mother has Alzheimer's, breast and cervical cancer.,  dementia- as did maternal grandfather and 2 maternal Great- Aunts.    Social history: She works in the [employer] [address] [employer] [place]. Noises are a great  distraction at her workplace,  she is a nonsmoker, quit smoking 12 years ago-took Chantix at the time,  nondrinker, Caffeine - 2  mugs in AM , one code in AM, none after 10 AM.  Review of Systems: Out of a complete 14 system review, the patient complains of only the following symptoms, and all other reviewed systems are negative.  " I would like to sleep longer "  GERD not affecting her sleep,  Constipation, haemorrhoid. Recent fall in a grocery store.   memory loss-  Forgets to put her new car into P. New job tasks take longer    Belsomra helps with sleep duration.   Epworth score 6  , Fatigue severity score 50  , depression score 2/15    Social History   Social History  . Marital status: Married    Spouse name: Nedra HaiLee  . Number of children: 1  . Years of education:  813   Occupational History  .  [employer]   Social History Main Topics  . Smoking status: Former Smoker    Packs/day: 1.00    Years: 20.00    Types: Cigarettes    Quit date: 03/24/2006  . Smokeless tobacco: Never Used  . Alcohol use No  . Drug use: No  . Sexual activity: Not on file   Other Topics Concern  . Not on file   Social History Narrative   Patient lives at home with her husband and works full time at [employer].    Education one year of college.   Drinks about 2 cups of coffee a day, and 1 soda a day     Family History  Problem Relation Age of Onset  . Dementia Mother   . Alzheimer's disease Maternal Aunt   . Alzheimer's disease Maternal Grandfather     Past Medical History:  Diagnosis Date  . Anxiety   . High cholesterol   . Memory change   . Thyroid condition     Past Surgical History:  Procedure Laterality Date  . ABDOMINAL HYSTERECTOMY    . NECK SURGERY    . TONSILLECTOMY      Current Outpatient Prescriptions  Medication Sig Dispense Refill  . BELSOMRA 20 MG TABS   3  . cholecalciferol (VITAMIN D) 400 units TABS tablet Take 400 Units by mouth.    . doxycycline (VIBRA-TABS) 100 MG tablet   0  . levothyroxine (SYNTHROID, LEVOTHROID) 75 MCG tablet Take 75 mcg by mouth daily before breakfast.    . Melatonin 5 MG TABS Take by mouth at bedtime as needed and may repeat dose one time if needed.     No current facility-administered medications for this visit.     Allergies as of 12/25/2016 - Review Complete 12/25/2016  Allergen Reaction Noted  . Penicillins Rash 10/12/2011    Vitals: BP 127/74   Pulse 61   Resp 16   Ht 5\' 5"  (1.651 m)   Wt 200 lb (90.7 kg)   BMI 33.28 kg/m  Last Weight:  Wt Readings from Last 1 Encounters:  12/25/16 200 lb (90.7 kg)   ZOX:WRUEBMI:Body mass index is 33.28 kg/m.     Last Height:   Ht Readings from Last 1 Encounters:  12/25/16 5\' 5"  (1.651 m)    Physical exam:  General: The patient is awake, alert and  appears not in acute distress. The patient is well groomed. Head: Normocephalic, atraumatic. Neck is supple. Mallampati 3,  neck circumference:15. Nasal airflow patent , No Retrognathia is seen.  Cardiovascular:  Regular rate and rhythm , without  murmurs or carotid bruit,  and without distended neck veins. Respiratory: Lungs are clear to auscultation. Skin:  Without evidence of edema, or rash Trunk: BMI is 33. The patient's posture is erect    Neurologic exam : The patient is awake and alert, oriented to place and time.   Memory subjective described as intact.  Attention span & concentration ability described as mildly impaired - distractable Speech is fluent,  without dysarthria, dysphonia or aphasia. Mood and affect are appropriate.  Cranial nerves: Pupils are equal and briskly reactive to light. Funduscopic exam without evidence of pallor or edema. Extraocular movements  in vertical and horizontal planes intact and without nystagmus. Visual fields by finger perimetry are intact.Hearing to finger rub intact. Facial sensation intact to fine touch. Facial motor strength is symmetric and tongue and uvula move midline. Shoulder shrug was symmetrical.   Motor exam:  Normal tone, muscle bulk and symmetric strength in all extremities. Sensory:  Fine touch, pinprick and vibration were  normal. Coordination: Rapid alternating movements in the fingers/hands was normal. Finger-to-nose maneuver  normal without evidence of ataxia, dysmetria or tremor. Gait and station: Patient walks without assistive device and is able unassisted to climb up to the exam table. Strength within normal limits.  Stance is stable and normal.  Tandem gait is unfragmented. Turns with 3 Steps. Romberg testing is negative.  Deep tendon reflexes: in the  upper and lower extremities are symmetric and intact. Babinski maneuver response is downgoing.  The patient was advised of the nature of the diagnosed sleep disorder , the  treatment options and risks for general a health and wellness arising from not treating the condition.  I spent more than 45  minutes of face to face time with the patient. Greater than 50% of time was spent in counseling and coordination of care. We have discussed the diagnosis and differential and I answered the patient's questions.     Assessment:  After physical and neurologic examination, review of laboratory studies,  Personal review of imaging studies, reports of other /same  Imaging studies ,  Results of polysomnography/ neurophysiology testing and pre-existing records as far as provided in visit., my assessment is   Insomnia is likely entrained, TV and early bed time may be contributing. She has been using sleep aids for years.   1) Anne Alexander has been sleep talking, been a restless sleeper, and moved to a recliner. Husband reports loud snoring and sleep choking, likely witnessed apnea.   2) memory impairment , attributed by patient to her poor sleep duration and quality.  She has neck and back pain , making sleeping in supine difficult   3) Tremor - new , on the right only - plus recent fall, but no cog-wheeling , no rigor.    Plan:  Treatment plan and additional workup :  SPLIT study in lab,  Parasomnia montage ( expanded EEG )    Melvyn Novas MD  12/25/2016   CC: Gaspar Garbe, Md [address] [place], [place] [address]

## 2017-01-01 ENCOUNTER — Telehealth: Payer: Self-pay | Admitting: Gastroenterology

## 2017-01-02 NOTE — Telephone Encounter (Signed)
Dr. Russella DarStark reviewed records and has accepted patient. Per Dr. Russella DarStark, pt not due for next colon until 03/2019. Left message for patient to return my call. Records have been sent to scan.

## 2017-01-12 ENCOUNTER — Ambulatory Visit (INDEPENDENT_AMBULATORY_CARE_PROVIDER_SITE_OTHER): Payer: 59 | Admitting: Neurology

## 2017-01-12 DIAGNOSIS — G478 Other sleep disorders: Secondary | ICD-10-CM

## 2017-01-12 DIAGNOSIS — G4730 Sleep apnea, unspecified: Secondary | ICD-10-CM | POA: Diagnosis not present

## 2017-01-12 DIAGNOSIS — G4700 Insomnia, unspecified: Secondary | ICD-10-CM

## 2017-01-18 MED FILL — DICLOFENAC SOD EC 50 MG TAB: 50 | 90 days supply | Qty: 180 | Fill #2

## 2017-01-18 NOTE — Procedures (Signed)
PATIENT'S NAME:  Anne Alexander, Lajoyce DOB:      11-01-1951      MR#:    [id number]     DATE OF RECORDING: 01/12/2017 REFERRING M.D.:  Guerry Bruinichard Tisovec, MD Study Performed:   Baseline Polysomnogram HISTORY:  Anne DecampBeverly A. Dahlia ClientBrowning is a 66 year old female patient seen here as a referral from Dr. Wylene Simmerisovec for a sleep evaluation. Sleep history: Insomnia- 15 years - progressing. Early bed time, early riser.  Med. Hx : Anterior cervical neck fusion, thyroid disease supplemented on Synthroid.  No history of TBI, concussion or contusion. The patient endorsed the Epworth Sleepiness Scale at 6 points.  The patient's weight 201 pounds with a height of 65 (inches), resulting in a BMI of 33.4 kg/m2.The patient's neck circumference measured 15 inches.  CURRENT MEDICATIONS: Vibra, Synthroid   PROCEDURE:  This is a multichannel digital polysomnogram utilizing the Somnostar 11.2 system.  Electrodes and sensors were applied and monitored per AASM Specifications.   EEG, EOG, Chin and Limb EMG, were sampled at 200 Hz.  ECG, Snore and Nasal Pressure, Thermal Airflow, Respiratory Effort, CPAP Flow and Pressure, Oximetry was sampled at 50 Hz. Digital video and audio were recorded.      BASELINE STUDY             Lights Out was at 20:58 and Lights On at 05:01.  Total recording time (TRT) was 483 minutes, with a total sleep time (TST) of 332.5 minutes. The patient's sleep latency was 9 minutes. REM latency was 99 minutes.  The sleep efficiency was 68.8 %.     SLEEP ARCHITECTURE: WASO (Wake after sleep onset) was 140.5 minutes.  There were 20.5 minutes in Stage N1, 91 minutes Stage N2, 116 minutes Stage N3 and 105 minutes in Stage REM.  The percentage of Stage N1 was 6.2%, Stage N2 was 27.4%, Stage N3 was 34.9% and Stage R (REM sleep) was 31.6%.   RESPIRATORY ANALYSIS:  There were 13 respiratory events:  0 obstructive apneas, 1 central apnea and 0 mixed apneas with a total of 1 apnea and an apnea index (AI) of 0.2 /hour. There were 12  hypopneas with a hypopnea index of 2.2 /hour. The patient also had 3 respiratory event related arousals (RERAs).     The total APNEA/HYPOPNEA INDEX (AHI) was 2.3/hour and the total RESPIRATORY DISTURBANCE INDEX was 2.9 /hour. 13 events occurred in REM sleep and 0 events in NREM. The REM AHI was 7.4 /hour, versus a non-REM AHI of 0. The patient spent 169 minutes of total sleep time in the supine position and 164 minutes in non-supine. The supine AHI was 3.6 versus a non-supine AHI of 1.1.  OXYGEN SATURATION & C02:  The Wake baseline 02 saturation was 96%, with the lowest being 85%. Time spent below 89% saturation equaled 3 minutes.  PERIODIC LIMB MOVEMENTS:  The patient had a total of 0 Periodic Limb Movements.  The arousals were noted as: 59 were spontaneous, 0 were associated with PLMs, and 10 were associated with respiratory events. Audio and video analysis did not show any abnormal or unusual movements, behaviors, phonations or vocalizations.  The patient took 2 bathroom breaks. Snoring was noted. EKG was in keeping with normal sinus rhythm (NSR).    IMPRESSION:  1. Repetitive Intrusions of Sleep, sleep fragmentation without evidence of physiological factors.   RECOMMENDATIONS:  1. Positional therapy is advised. Snoring can be treated with a dental device but does not necessary need to be treated.  2. Advise to  lose weight, diet and exercise if not contraindicated (BMI 33). 3. Advise to arrange for dedicated sleep psychology referral, since insomnia is of clinical concern.   4. A follow up appointment will be scheduled in the Sleep Clinic at [facility]. The referring provider will be notified of the results.      I certify that I have reviewed the entire raw data recording prior to the issuance of this report in accordance with the Standards of Accreditation of the American Academy of Sleep Medicine (AASM)    Melvyn Novas, MD  01-18-2017  Diplomat, American Board  of Psychiatry and Neurology  Diplomat, American Board of Sleep Medicine Medical Director, [facility] [employer]

## 2017-01-22 ENCOUNTER — Telehealth: Payer: Self-pay

## 2017-01-22 NOTE — Telephone Encounter (Signed)
-----   Message from Melvyn Novasarmen Dohmeier, MD sent at 01/18/2017  6:37 PM EDT ----- No organic cause of insomnia noted. Referral to psychology recommended. CD

## 2017-01-22 NOTE — Telephone Encounter (Signed)
I called pt to discuss her sleep study results. No answer, left a message asking her to call me back. 

## 2017-01-23 NOTE — Telephone Encounter (Signed)
Pt called back, RN skyped, said she could call back in 15 min. Pt said to call back in 25 min at home

## 2017-01-23 NOTE — Telephone Encounter (Signed)
Pt returned RN's call. Said she has to return to work and will try back when she gets back home

## 2017-01-23 NOTE — Telephone Encounter (Signed)
I called pt to discuss her sleep study results. I advised her that no organic cause on insomnia noted and that Dr. Vickey Hugerohmeier recommends a referral to psychology. Pt says that she will discuss this referral for Dr. Wylene Simmerisovec. Pt is also asking that I mail her a copy of her sleep study results. I verified pt's address with her. Pt also says that her insurance did not cover the rest of her copay with [employer]; she paid her $40 but then her insurance did not cover the rest of the visit and now she owes $250. Pt is asking for our billing department to call and discuss this. Pt verbalized understanding of results. Pt had no questions at this time but was encouraged to call back if questions arise.

## 2017-01-29 ENCOUNTER — Encounter (HOSPITAL_COMMUNITY): Payer: Self-pay

## 2017-01-29 ENCOUNTER — Emergency Department (HOSPITAL_COMMUNITY)
Admission: EM | Admit: 2017-01-29 | Discharge: 2017-01-29 | Disposition: A | Discharge status: Home / Self Care | Payer: 59 | Attending: Emergency Medicine | Admitting: Emergency Medicine

## 2017-01-29 ENCOUNTER — Emergency Department (HOSPITAL_COMMUNITY): Payer: 59

## 2017-01-29 DIAGNOSIS — S60222A Contusion of left hand, initial encounter: Secondary | ICD-10-CM | POA: Insufficient documentation

## 2017-01-29 DIAGNOSIS — Z87891 Personal history of nicotine dependence: Secondary | ICD-10-CM | POA: Diagnosis not present

## 2017-01-29 DIAGNOSIS — Y9301 Activity, walking, marching and hiking: Secondary | ICD-10-CM | POA: Insufficient documentation

## 2017-01-29 DIAGNOSIS — Y999 Unspecified external cause status: Secondary | ICD-10-CM | POA: Insufficient documentation

## 2017-01-29 DIAGNOSIS — Y9289 Other specified places as the place of occurrence of the external cause: Secondary | ICD-10-CM | POA: Insufficient documentation

## 2017-01-29 DIAGNOSIS — W010XXA Fall on same level from slipping, tripping and stumbling without subsequent striking against object, initial encounter: Secondary | ICD-10-CM | POA: Insufficient documentation

## 2017-01-29 DIAGNOSIS — W19XXXA Unspecified fall, initial encounter: Secondary | ICD-10-CM

## 2017-01-29 DIAGNOSIS — M25532 Pain in left wrist: Secondary | ICD-10-CM | POA: Diagnosis not present

## 2017-01-29 DIAGNOSIS — M79642 Pain in left hand: Secondary | ICD-10-CM | POA: Diagnosis not present

## 2017-01-29 DIAGNOSIS — S6992XA Unspecified injury of left wrist, hand and finger(s), initial encounter: Secondary | ICD-10-CM | POA: Diagnosis not present

## 2017-01-29 NOTE — ED Triage Notes (Signed)
Pt reports she tripped and fell today and landed on her left hand. She reports pain to left hand, left wrist, right shin and right knee. Minor abrasions to these areas. No LOC, no head injury.

## 2017-01-29 NOTE — Progress Notes (Signed)
Orthopedic Tech Progress Note Patient Details:  Anne MapleBeverly A Alexander 1951-05-23 [id number]  Ortho Devices Type of Ortho Device: Thumb velcro splint Ortho Device/Splint Location: LUE Ortho Device/Splint Interventions: Ordered, Application   Jennye MoccasinHughes, Carri Spillers Craig 01/29/2017, 6:40 PM

## 2017-01-29 NOTE — ED Provider Notes (Signed)
[facility]-EMERGENCY DEPT Provider Note   CSN: [id number] Arrival date & time: 01/29/17  1547     History   Chief Complaint Chief Complaint  Patient presents with  . Fall    HPI Anne Alexander is a 67 y.o. female.  The history is provided by the patient and medical records. No language interpreter was used.  Fall    Anne Alexander is a 66 y.o. female who presents to the Emergency Department For evaluation after a mechanical fall just prior to arrival. Patient states that she tripped while walking outside, falling onto her stretched left hand, then her right knee. She states she immediately got up and has been ambulatory. She denies any pain to the right knee or shin, but does state there is bruising to the area. Pain of the left wrist is worse with movement. Better when Still. She did not hit her head nor lose consciousness. Her main complaint today is left hand and wrist pain. Initially, she noticed some tingling and numbness in her fingertips, but that is now resolved. No prior injuries to the left upper extremity. No medications taken prior to arrival for symptoms.   Past Medical History:  Diagnosis Date  . Anxiety   . High cholesterol   . Memory change   . Thyroid condition     Patient Active Problem List   Diagnosis Date Noted  . Memory change     Past Surgical History:  Procedure Laterality Date  . ABDOMINAL HYSTERECTOMY    . NECK SURGERY    . TONSILLECTOMY      OB History    No data available       Home Medications    Prior to Admission medications   Medication Sig Start Date End Date Taking? Authorizing Provider  BELSOMRA 20 MG TABS  12/11/16   Historical Provider, MD  cholecalciferol (VITAMIN D) 400 units TABS tablet Take 400 Units by mouth.    Historical Provider, MD  doxycycline (VIBRA-TABS) 100 MG tablet  11/28/16   Historical Provider, MD  levothyroxine (SYNTHROID, LEVOTHROID) 75 MCG tablet Take 75 mcg by mouth daily before breakfast.    Historical  Provider, MD  Melatonin 5 MG TABS Take by mouth at bedtime as needed and may repeat dose one time if needed.    Historical Provider, MD    Family History Family History  Problem Relation Age of Onset  . Dementia Mother   . Breast cancer Mother   . Alzheimer's disease Maternal Aunt   . Alzheimer's disease Maternal Grandfather     Social History Social History  Substance Use Topics  . Smoking status: Former Smoker    Packs/day: 1.00    Years: 20.00    Types: Cigarettes    Quit date: 03/24/2006  . Smokeless tobacco: Never Used  . Alcohol use No     Allergies   Penicillins   Review of Systems Review of Systems  Musculoskeletal: Positive for arthralgias.  Skin: Positive for color change (Bruising).  Neurological: Negative for dizziness and weakness.     Physical Exam Updated Vital Signs BP 139/66 (BP Location: Left Arm)   Pulse 71   Temp 99.2 F (37.3 C) (Oral)   Resp 18   SpO2 98%   Physical Exam  Constitutional: She appears well-developed and well-nourished. No distress.  HENT:  Head: Normocephalic and atraumatic.  Neck: Neck supple.  Cardiovascular: Normal rate, regular rhythm and normal heart sounds.   No murmur heard. Pulmonary/Chest: Effort normal and breath  sounds normal. No respiratory distress. She has no wheezes. She has no rales.  Musculoskeletal:  Left hand/wrist: + swelling and mild bruising to the thenar eminence which is tender to palpation. Full ROM. Good grip strength and pincer grasp. The patient has normal sensation and motor function in the median, ulnar, and radial nerve distributions. There is no anatomic snuff box tenderness. The patient has normal active and passive range of motion of their digits. 2+ radial pulse.  Right knee with full range of motion and no tenderness to palpation. + Bruising along the right shin  Neurological: She is alert.  Skin: Skin is warm and dry.  Nursing note and vitals reviewed.    ED Treatments / Results    Labs (all labs ordered are listed, but only abnormal results are displayed) Labs Reviewed - No data to display  EKG  EKG Interpretation None       Radiology Dg Wrist Complete Left  Result Date: 01/29/2017 CLINICAL DATA:  Was walking outside and tripped/fell on sidewalk x 215pm, pain in anterior wrist/hand, numbness up into left elbow EXAM: LEFT WRIST - COMPLETE 3+ VIEW COMPARISON:  None FINDINGS: No distal radius or ulnar fracture. Radiocarpal joint is intact. No carpal fracture. No soft tissue abnormality. IMPRESSION: No fracture or dislocation. Electronically Signed   By: Genevive BiStewart  Edmunds M.D.   On: 01/29/2017 16:50   Dg Hand Complete Left  Result Date: 01/29/2017 CLINICAL DATA:  Was walking outside and tripped/fell on sidewalk x 215pm, pain in anterior wrist/hand, numbness up into left elbow EXAM: LEFT HAND - COMPLETE 3+ VIEW COMPARISON:  None. FINDINGS: No evidence of fracture of the carpal or metacarpal bones. Radiocarpal joint is intact. Phalanges are normal. No soft tissue injury. IMPRESSION: No fracture or dislocation. Electronically Signed   By: Genevive BiStewart  Edmunds M.D.   On: 01/29/2017 16:49    Procedures Procedures (including critical care time)  Medications Ordered in ED Medications - No data to display   Initial Impression / Assessment and Plan / ED Course  I have reviewed the triage vital signs and the nursing notes.  Pertinent labs & imaging results that were available during my care of the patient were reviewed by me and considered in my medical decision making (see chart for details).    Anne Alexander is a 66 y.o. female who presents to ED for Evaluation after a mechanical fall earlier today. Her main complaint today is her left hand/wrist pain. On exam, left upper extremity is neurovascularly intact and she has no anatomical snuffbox tenderness. X-rays reviewed and negative for acute injury. Patient did also fall on right knee/shin. She has no tenderness on  exam and has full range of motion of the right lower extremity. She denies any pain at this time. Discussed possibly obtaining x-ray, however patient does not believe this is necessary and I agree given reassuring exam. Symptomatic home care instructions including RICE were discussed. Patient takes diclofenac twice a day already, therefore will not prescribe NSAID. Velcro thumb spica provided in ED for comfort. Orthopedic follow-up if symptoms do not improve. Return precautions discussed and all questions answered.  Final Clinical Impressions(s) / ED Diagnoses   Final diagnoses:  Fall, initial encounter  Contusion of left hand, initial encounter  Left wrist pain    New Prescriptions Discharge Medication List as of 01/29/2017  6:21 PM       [facility] Surgery CenterJaime Pilcher Francis Yardley, PA-C 01/29/17 1903    Shaune Pollackameron Isaacs, MD 01/29/17 2049

## 2017-01-29 NOTE — Discharge Instructions (Signed)
Ice affected area as needed for pain/swelling. Keeping the hand/wrist elevated will aide in additional relief in swelling.  If symptoms are not improved in one week, please call the orthopedic clinic listed to schedule a follow up appointment.  Return to the ER for new or worsening symptoms, any additional concerns.   COLD THERAPY DIRECTIONS:  Ice or gel packs can be used to reduce both pain and swelling. Ice is the most helpful within the first 24 to 48 hours after an injury or flareup from overusing a muscle or joint.  Ice is effective, has very few side effects, and is safe for most people to use.   If you expose your skin to cold temperatures for too long or without the proper protection, you can damage your skin or nerves. Watch for signs of skin damage due to cold.   HOME CARE INSTRUCTIONS  Follow these tips to use ice and cold packs safely.  Place a dry or damp towel between the ice and skin. A damp towel will cool the skin more quickly, so you may need to shorten the time that the ice is used.  For a more rapid response, add gentle compression to the ice.  Ice for no more than 10 to 20 minutes at a time. The bonier the area you are icing, the less time it will take to get the benefits of ice.  Check your skin after 5 minutes to make sure there are no signs of a poor response to cold or skin damage.  Rest 20 minutes or more in between uses.  Once your skin is numb, you can end your treatment. You can test numbness by very lightly touching your skin. The touch should be so light that you do not see the skin dimple from the pressure of your fingertip. When using ice, most people will feel these normal sensations in this order: cold, burning, aching, and numbness.

## 2017-01-30 MED FILL — BELSOMRA 20 MG TABLET: 20 | 30 days supply | Qty: 30 | Fill #0

## 2017-03-12 MED FILL — SYNTHROID 75 MCG TABLET: 75 | 90 days supply | Qty: 90 | Fill #2

## 2017-03-14 MED FILL — BELSOMRA 20 MG TABLET: 20 | 30 days supply | Qty: 30 | Fill #1

## 2017-04-11 MED FILL — BELSOMRA 20 MG TABLET: 20 | 30 days supply | Qty: 30 | Fill #2

## 2017-04-18 DIAGNOSIS — H5203 Hypermetropia, bilateral: Secondary | ICD-10-CM | POA: Diagnosis not present

## 2017-04-19 MED FILL — DICLOFENAC SOD EC 50 MG TAB: 50 | 90 days supply | Qty: 180 | Fill #0

## 2017-04-26 DIAGNOSIS — R413 Other amnesia: Secondary | ICD-10-CM | POA: Diagnosis not present

## 2017-04-26 DIAGNOSIS — N393 Stress incontinence (female) (male): Secondary | ICD-10-CM | POA: Diagnosis not present

## 2017-04-26 DIAGNOSIS — K219 Gastro-esophageal reflux disease without esophagitis: Secondary | ICD-10-CM | POA: Diagnosis not present

## 2017-04-26 DIAGNOSIS — M1990 Unspecified osteoarthritis, unspecified site: Secondary | ICD-10-CM | POA: Diagnosis not present

## 2017-04-26 DIAGNOSIS — E042 Nontoxic multinodular goiter: Secondary | ICD-10-CM | POA: Diagnosis not present

## 2017-04-26 DIAGNOSIS — E668 Other obesity: Secondary | ICD-10-CM | POA: Diagnosis not present

## 2017-04-26 DIAGNOSIS — G4709 Other insomnia: Secondary | ICD-10-CM | POA: Diagnosis not present

## 2017-04-26 DIAGNOSIS — E7800 Pure hypercholesterolemia, unspecified: Secondary | ICD-10-CM | POA: Diagnosis not present

## 2017-04-26 DIAGNOSIS — E038 Other specified hypothyroidism: Secondary | ICD-10-CM | POA: Diagnosis not present

## 2017-05-11 ENCOUNTER — Encounter: Payer: Self-pay | Admitting: Gynecology

## 2017-05-11 ENCOUNTER — Ambulatory Visit (INDEPENDENT_AMBULATORY_CARE_PROVIDER_SITE_OTHER): Payer: 59 | Admitting: Gynecology

## 2017-05-11 VITALS — BP 120/80 | Ht 65.0 in | Wt 177.0 lb

## 2017-05-11 DIAGNOSIS — Z01411 Encounter for gynecological examination (general) (routine) with abnormal findings: Secondary | ICD-10-CM

## 2017-05-11 DIAGNOSIS — N952 Postmenopausal atrophic vaginitis: Secondary | ICD-10-CM | POA: Diagnosis not present

## 2017-05-11 DIAGNOSIS — N393 Stress incontinence (female) (male): Secondary | ICD-10-CM | POA: Diagnosis not present

## 2017-05-11 DIAGNOSIS — N8111 Cystocele, midline: Secondary | ICD-10-CM

## 2017-05-11 DIAGNOSIS — N816 Rectocele: Secondary | ICD-10-CM | POA: Diagnosis not present

## 2017-05-11 MED FILL — BELSOMRA 20 MG TABLET: 20 | 30 days supply | Qty: 30 | Fill #3

## 2017-05-11 NOTE — Progress Notes (Addendum)
Anne Alexander 1951-03-20 [id number]        66 y.o.  G1P0001 presents in consultation from Dr. Wylene Simmerisovec it due to vaginal prolapse.  It has been a number of years since she has had a gynecologic exam. For the past 5-6 years she has noticed some bulging from the vagina which seems to be heading worse over the past year. She will have to apply pressure digitally sometimes to achieve bowel movement. She's having a lot of gas. Does have a history of hard stools with constipation that now she is using Metamucil and me relax which as helping with this. Also has some urinary urgency which has been going on for the last several years. She does relate having some form of bladder suspension in the past for incontinence when she was younger but cannot remember what was done. She does have some mild stress symptoms to include loss of urine with laughing coughing sneezing. She is status post TAH with subsequent separate BSO for endometriosis in her 8520s.  Past medical history,surgical history, problem list, medications, allergies, family history and social history were all reviewed and documented as reviewed in the EPIC chart.  ROS:  Performed with pertinent positives and negatives included in the history, assessment and plan.   Additional significant findings :  None   Exam: Bari MantisKim Alexis assistant Vitals:   05/11/17 1519  BP: 120/80  Weight: 177 lb (80.3 kg)  Height: 5\' 5"  (1.651 m)   Body mass index is 29.45 kg/m.  General appearance:  Normal affect, orientation and appearance. Skin: Grossly normal HEENT: Without gross lesions.  No cervical or supraclavicular adenopathy. Thyroid normal.  Lungs:  Clear without wheezing, rales or rhonchi Cardiac: RR, without RMG Abdominal:  Soft, nontender, without masses, guarding, rebound, organomegaly or hernia Breasts:  Examined lying and sitting without masses, retractions, discharge or axillary adenopathy. Pelvic:  Ext, BUS, Vagina: With atrophic changes.  Second-degree rectocele bulging from the introitus. First-degree cystocele. Cuff well supported. Pap smear of vaginal cuff done  Adnexa: Without masses or tenderness    Anus and perineum: Normal   Rectovaginal: Normal sphincter tone without palpated masses or tenderness.    Assessment/Plan:  66 y.o. G1P0001 female:  1. Rectocele/cystocele. Present for a number of years historically. Getting worse over the past year particularly. Using digital pressure sometimes for bowel movements but not consistently. Issues with constipation but she is addressing them now with Metamucil and Miralax which seems to be helping.  The anatomy of the situation was reviewed with the patient to include using diagrams and pictures. I reviewed the issues of observation versus intervention as far as quality of life. Options for intervention reviewed to include pessary versus surgery. She is not sexually active nor plans to be. She has a great version to surgery in particular general anesthesia. I brought in the fitting case for pessaries and reviewed the various pessaries available and how they worked. What is involved with using a pessary as far as maintenance discussed. I also reviewed surgery with her to include anterior and posterior colporrhaphy's with or without graft/mesh. After a lengthy discussion the patient is not interested in intervention at this time but would prefer to monitor her symptoms. If she is interested in intervention she would desire a trial of pessary and will return for this.  She does report having some form of bladder suspension a number of years ago. She currently is having some mild stress symptoms with loss of urine with coughing sneezing  and laughing. She is not interested in intervention with this at this time. 2. Postmenopausal/atrophic genital changes. No significant hot flushes, night sweats or vaginal dryness. 3. Mammogram reported within the past year. Patient will continue with annual  mammography is when due. Breast exam normal today. 4. Patient just recently checked about colonoscopy and is not due until 2020 and she'll follow up with them in reference to this. 5. DEXA reported a number of years ago. Recommend follow up DEXA next year at age 26. 6. Pap smear a number of years ago. Pap smear of vaginal cuff done today. Current recommendations as far as no history of abnormal Pap smears and options to stop screening altogether she is status post hysterectomy for benign indications reviewed. Will readdress on annual basis. 7. Health maintenance. No routine lab work done as patient does this elsewhere. Follow up in one year, sooner if rectocele/cystocele worsen or she wants to readdress the pessary issue.  Greater than 50% of my time was spent in direct face to face counseling and coordination of care with the patient.    Dara Lords MD, 4:03 PM 05/11/2017

## 2017-05-11 NOTE — Patient Instructions (Signed)
Follow up in one year, sooner if any issues with the rectocele or cystocele.

## 2017-05-12 LAB — URINALYSIS W MICROSCOPIC + REFLEX CULTURE
Bacteria, UA: NONE SEEN [HPF]
Bilirubin Urine: NEGATIVE
Casts: NONE SEEN [LPF]
Crystals: NONE SEEN [HPF]
GLUCOSE, UA: NEGATIVE
HGB URINE DIPSTICK: NEGATIVE
KETONES UR: NEGATIVE
LEUKOCYTES UA: NEGATIVE
NITRITE: NEGATIVE
PH: 5.5 (ref 5.0–8.0)
Protein, ur: NEGATIVE
RBC / HPF: NONE SEEN RBC/HPF (ref <=2)
Specific Gravity, Urine: 1.016 (ref 1.001–1.035)
WBC, UA: NONE SEEN WBC/HPF (ref <=5)
Yeast: NONE SEEN [HPF]

## 2017-05-15 LAB — PAP IG W/ RFLX HPV ASCU

## 2017-06-04 MED FILL — BELSOMRA 20 MG TABLET: 20 | 30 days supply | Qty: 30 | Fill #4

## 2017-06-06 DIAGNOSIS — M8580 Other specified disorders of bone density and structure, unspecified site: Secondary | ICD-10-CM

## 2017-06-06 HISTORY — DX: Other specified disorders of bone density and structure, unspecified site: M85.80

## 2017-06-14 ENCOUNTER — Encounter: Payer: Self-pay | Admitting: Gynecology

## 2017-06-14 ENCOUNTER — Ambulatory Visit (INDEPENDENT_AMBULATORY_CARE_PROVIDER_SITE_OTHER): Payer: 59 | Admitting: Gynecology

## 2017-06-14 VITALS — BP 116/76

## 2017-06-14 DIAGNOSIS — N952 Postmenopausal atrophic vaginitis: Secondary | ICD-10-CM | POA: Diagnosis not present

## 2017-06-14 DIAGNOSIS — N816 Rectocele: Secondary | ICD-10-CM | POA: Diagnosis not present

## 2017-06-14 NOTE — Patient Instructions (Signed)
Gynecologists to specializes in pelvic repair surgeries: Lavella Hammockatherine Matthews, MD [phone] [facility]  Urologist who specializes in pelvic repair surgeries: Alfredo MartinezScott MacDiarmid, MD [phone] [address]

## 2017-06-14 NOTE — Progress Notes (Signed)
    Anne MapleBeverly A Alexander 1951-05-01 [id number]        66 y.o.  G1P0001 who returns for trial of pessary fitting. Recently evaluated and found to have a second-degree rectocele bulging from the introitus. Also with a first-degree cystocele. Cuff appears well supported. Status post TAH in the past with Raz bladder suspension.  Past medical history,surgical history, problem list, medications, allergies, family history and social history were all reviewed and documented in the EPIC chart.  Directed ROS with pertinent positives and negatives documented in the history of present illness/assessment and plan.  Exam: Kennon PortelaKim Gardner assistant Vitals:   06/14/17 0817  BP: 116/76   General appearance:  Normal Abdomen soft nontender without masses guarding rebound Pelvic external BUS vagina with atrophic changes. First-degree cystocele, second-degree rectocele. Cuff well supported. Bimanual without masses or tenderness.  Multiple sizes and types of pessaries were tried unsuccessfully where patient would pass them while bearing-down or squatting. Her pelvic anatomy is somewhat narrowed which limited the size of pessary that could be placed without causing significant discomfort to the patient.  Assessment/Plan:  66 y.o. G1P0001 with failed trial of pessary due to pelvic anatomy. I spent a lot of time with the patient trying various pessaries as well as discussing other options to include surgery. The various types of surgery to include straightforward posterior colporrhaphy versus use of graft or mesh. Issue as to whether other supportive surgery such as cystocele should be done at the same time was also discussed. Her main issue appears to be her rectocele. My recommendation is that if she wants to pursue surgery that I will refer her to a physician who specializes in this type of pelvic repair. My recommendation was for her to see Lavella Hammockatherine Matthews patient also [place]  and I discussed Dr. Lorin PicketScott MacDiarmid who is a urologist but also has experience with pelvic reparative surgeries. Names and numbers provided for both physicians. She wants to think about this and discuss with her husband and will arrange for a follow up consult with one or both of these physicians for evaluation and pursuit of surgery.  Greater than 50% of my 25 minute visit was spent in direct face to face counseling and coordination of care with the patient.      Dara LordsFONTAINE,Bridgitt Raggio P MD, 9:00 AM 06/14/2017

## 2017-06-15 MED FILL — SYNTHROID 75 MCG TABLET: 75 | 90 days supply | Qty: 90 | Fill #3

## 2017-07-04 MED FILL — DICLOFENAC SOD EC 50 MG TAB: 50 | 90 days supply | Qty: 180 | Fill #1

## 2017-07-04 MED FILL — BELSOMRA 20 MG TABLET: 20 | 30 days supply | Qty: 30 | Fill #5

## 2017-09-20 MED FILL — SYNTHROID 75 MCG TABLET: 75 | 90 days supply | Qty: 90 | Fill #0

## 2017-10-19 DIAGNOSIS — E038 Other specified hypothyroidism: Secondary | ICD-10-CM | POA: Diagnosis not present

## 2017-10-19 DIAGNOSIS — Z0000 Encounter for general adult medical examination without abnormal findings: Secondary | ICD-10-CM | POA: Diagnosis not present

## 2017-10-19 DIAGNOSIS — R82998 Other abnormal findings in urine: Secondary | ICD-10-CM | POA: Diagnosis not present

## 2017-10-26 DIAGNOSIS — Z960 Presence of urogenital implants: Secondary | ICD-10-CM | POA: Diagnosis not present

## 2017-10-26 DIAGNOSIS — E668 Other obesity: Secondary | ICD-10-CM | POA: Diagnosis not present

## 2017-10-26 DIAGNOSIS — N8110 Cystocele, unspecified: Secondary | ICD-10-CM | POA: Diagnosis not present

## 2017-10-26 DIAGNOSIS — E7800 Pure hypercholesterolemia, unspecified: Secondary | ICD-10-CM | POA: Diagnosis not present

## 2017-10-26 DIAGNOSIS — Z0000 Encounter for general adult medical examination without abnormal findings: Secondary | ICD-10-CM | POA: Diagnosis not present

## 2017-10-26 DIAGNOSIS — N393 Stress incontinence (female) (male): Secondary | ICD-10-CM | POA: Diagnosis not present

## 2017-10-26 DIAGNOSIS — N816 Rectocele: Secondary | ICD-10-CM | POA: Diagnosis not present

## 2017-10-26 DIAGNOSIS — Z1389 Encounter for screening for other disorder: Secondary | ICD-10-CM | POA: Diagnosis not present

## 2017-10-26 DIAGNOSIS — K219 Gastro-esophageal reflux disease without esophagitis: Secondary | ICD-10-CM | POA: Diagnosis not present

## 2017-10-26 DIAGNOSIS — F5104 Psychophysiologic insomnia: Secondary | ICD-10-CM | POA: Diagnosis not present

## 2017-10-26 MED FILL — BACITRACIN-POLYMYXIN EYE OI: 500-10000 | 18 days supply | Qty: 4 | Fill #0

## 2017-10-26 MED FILL — DICLOFENAC SOD EC 50 MG TAB: 50 | 45 days supply | Qty: 90 | Fill #0

## 2017-10-31 MED FILL — BELSOMRA 20 MG TABLET: 20 | 30 days supply | Qty: 30 | Fill #0

## 2017-11-01 ENCOUNTER — Other Ambulatory Visit: Payer: Self-pay | Admitting: Internal Medicine

## 2017-11-01 DIAGNOSIS — E7800 Pure hypercholesterolemia, unspecified: Secondary | ICD-10-CM

## 2017-11-01 DIAGNOSIS — Z1212 Encounter for screening for malignant neoplasm of rectum: Secondary | ICD-10-CM | POA: Diagnosis not present

## 2017-11-05 ENCOUNTER — Ambulatory Visit
Admission: RE | Admit: 2017-11-05 | Discharge: 2017-11-05 | Disposition: A | Discharge status: Home / Self Care | Payer: No Typology Code available for payment source | Source: Ambulatory Visit | Attending: Internal Medicine | Admitting: Internal Medicine

## 2017-11-05 DIAGNOSIS — E7800 Pure hypercholesterolemia, unspecified: Secondary | ICD-10-CM

## 2017-11-26 ENCOUNTER — Other Ambulatory Visit: Payer: Self-pay | Admitting: Internal Medicine

## 2017-11-26 DIAGNOSIS — Z1231 Encounter for screening mammogram for malignant neoplasm of breast: Secondary | ICD-10-CM

## 2017-11-29 DIAGNOSIS — H10413 Chronic giant papillary conjunctivitis, bilateral: Secondary | ICD-10-CM | POA: Diagnosis not present

## 2017-11-29 MED FILL — BELSOMRA 20 MG TABLET: 20 | 30 days supply | Qty: 30 | Fill #1

## 2017-12-12 MED FILL — SYNTHROID 75 MCG TABLET: 75 | 90 days supply | Qty: 90 | Fill #0

## 2017-12-24 ENCOUNTER — Ambulatory Visit
Admission: RE | Admit: 2017-12-24 | Discharge: 2017-12-24 | Disposition: A | Discharge status: Home / Self Care | Payer: 59 | Source: Ambulatory Visit | Attending: Internal Medicine | Admitting: Internal Medicine

## 2017-12-24 DIAGNOSIS — Z1231 Encounter for screening mammogram for malignant neoplasm of breast: Secondary | ICD-10-CM

## 2017-12-26 ENCOUNTER — Ambulatory Visit
Admission: RE | Admit: 2017-12-26 | Discharge: 2017-12-26 | Disposition: A | Discharge status: Home / Self Care | Payer: 59 | Source: Ambulatory Visit | Attending: Internal Medicine | Admitting: Internal Medicine

## 2017-12-26 DIAGNOSIS — Z1231 Encounter for screening mammogram for malignant neoplasm of breast: Secondary | ICD-10-CM | POA: Diagnosis not present

## 2017-12-27 MED FILL — BELSOMRA 20 MG TABLET: 20 | 30 days supply | Qty: 30 | Fill #2

## 2017-12-28 DIAGNOSIS — H1033 Unspecified acute conjunctivitis, bilateral: Secondary | ICD-10-CM | POA: Diagnosis not present

## 2017-12-28 MED FILL — FLUOROMETHOLONE 0.1% DROPS: 0.1 | 17 days supply | Qty: 5 | Fill #0

## 2018-01-22 DIAGNOSIS — H1033 Unspecified acute conjunctivitis, bilateral: Secondary | ICD-10-CM | POA: Diagnosis not present

## 2018-01-23 MED FILL — FLUOROMETHOLONE 0.1% DROPS: 0.1 | 25 days supply | Qty: 5 | Fill #0

## 2018-01-24 MED FILL — BELSOMRA 20 MG TABLET: 20 | 30 days supply | Qty: 30 | Fill #3

## 2018-02-21 MED FILL — BELSOMRA 20 MG TABLET: 20 | 30 days supply | Qty: 30 | Fill #4

## 2018-03-05 DIAGNOSIS — H43811 Vitreous degeneration, right eye: Secondary | ICD-10-CM | POA: Diagnosis not present

## 2018-03-14 MED FILL — SYNTHROID 75 MCG TABLET: 75 | 90 days supply | Qty: 90 | Fill #1

## 2018-04-18 DIAGNOSIS — H5203 Hypermetropia, bilateral: Secondary | ICD-10-CM | POA: Diagnosis not present

## 2018-04-18 DIAGNOSIS — H524 Presbyopia: Secondary | ICD-10-CM | POA: Diagnosis not present

## 2018-04-19 MED FILL — OLOPATADINE HCL 0.1% EYE DR: 0.1 | 25 days supply | Qty: 5 | Fill #0

## 2018-05-02 MED FILL — BACITRACIN-POLYMYXIN EYE OI: 500-10000 | 18 days supply | Qty: 4 | Fill #1

## 2018-05-30 ENCOUNTER — Ambulatory Visit (INDEPENDENT_AMBULATORY_CARE_PROVIDER_SITE_OTHER): Payer: 59 | Admitting: Gynecology

## 2018-05-30 ENCOUNTER — Encounter: Payer: Self-pay | Admitting: Gynecology

## 2018-05-30 VITALS — BP 120/74 | Ht 65.5 in | Wt 195.0 lb

## 2018-05-30 DIAGNOSIS — N952 Postmenopausal atrophic vaginitis: Secondary | ICD-10-CM

## 2018-05-30 DIAGNOSIS — Z803 Family history of malignant neoplasm of breast: Secondary | ICD-10-CM

## 2018-05-30 DIAGNOSIS — N816 Rectocele: Secondary | ICD-10-CM | POA: Diagnosis not present

## 2018-05-30 DIAGNOSIS — Z01419 Encounter for gynecological examination (general) (routine) without abnormal findings: Secondary | ICD-10-CM | POA: Diagnosis not present

## 2018-05-30 NOTE — Patient Instructions (Signed)
Follow-up for bone density as scheduled.  Call if you would like an appointment with the genetic counselor to discuss calculating your breast cancer risk.

## 2018-05-30 NOTE — Progress Notes (Signed)
Anne Alexander 1951-02-16 [id number]        67 y.o.  G1P0001 for annual gynecologic exam.  Patient has questions about her family history of breast cancer and risk assessment.  Also has a history of rectocele that we discussed referral to Dr. Lavella Hammockatherine Alexander last year none for which the patient decided against at this time.  Past medical history,surgical history, problem list, medications, allergies, family history and social history were all reviewed and documented as reviewed in the EPIC chart.  ROS:  Performed with pertinent positives and negatives included in the history, assessment and plan.   Additional significant findings : None   Exam: Kennon PortelaKim Alexander assistant Vitals:   05/30/18 0815  BP: 120/74  Weight: 195 lb (88.5 kg)  Height: 5' 5.5" (1.664 m)   Body mass index is 31.96 kg/m.  General appearance:  Normal affect, orientation and appearance. Skin: Grossly normal HEENT: Without gross lesions.  No cervical or supraclavicular adenopathy. Thyroid normal.  Lungs:  Clear without wheezing, rales or rhonchi Cardiac: RR, without RMG Abdominal:  Soft, nontender, without masses, guarding, rebound, organomegaly or hernia Breasts:  Examined lying and sitting without masses, retractions, discharge or axillary adenopathy. Pelvic:  Ext, BUS, Vagina: With atrophic changes.  First-degree cystocele.  Second-degree rectocele.  Cuff supported  Adnexa: Without masses or tenderness    Anus and perineum: Normal   Rectovaginal: Normal sphincter tone without palpated masses or tenderness.    Assessment/Plan:  67 y.o. 341P0001 female for annual gynecologic exam status post TAH, BSO in the past for endometriosis.  Also had bladder suspension..  1. Postmenopausal/atrophic genital changes.  No significant menopausal symptoms. 2. Rectocele/cystocele.  Was unable to fit a pessary.  Offered referral to Dr. Lavella Hammockatherine Alexander last year to consider surgical repair.  Patient has decided against  this right now.  She is managing with Metamucil as needed as when she is constipated she is most symptomatic.  She will call if she changes her mind and desires referral sooner. 3. Family history of breast cancer.  Mother had postmenopausal breast cancer.  Her sister apparently underwent risk assessment with several different models and was found to have a greater than 20% lifetime risk of breast cancer warranting MRI screening.  The patient had questions about having her calculations to see if she would benefit from MRI screening.  I discussed the issues of genetic linkage and possible increased risk of breast cancer.  I discussed various models were used to help calculated patient's risk of breast cancer and if it exceeds 20% lifetime risk the ACR recommends offering annual MRI in addition to mammography for screening.  I recommended the patient follow-up with a genetic counselor to help calculate her risk of breast cancer not only from a is genetic testing warranted but also from a is MRI annual screening warranted.  At this time the patient declines referral but will call if she changes her mind.  The patient does note some right breast tenderness that comes and goes that is migratory from the axilla to the medial portions of her breast at different times.  Her exam is normal.  Her recent mammogram 12/2017 was negative.  Patient will continue with self breast exams and as long as no palpable abnormalities and this discomfort comes and goes then will monitor.  We will follow-up for her annual mammography when due. 4. Colonoscopy 2012.  Repeat at their recommended interval. 5. DEXA 2005 normal.  Recommend follow-up DEXA now at age 67 and  patient will schedule in follow-up for this. 6. Pap smear 2018.  No Pap smear done today.  No history of significant abnormal Pap smears.  Options to stop screening now based on age and hysterectomy history.  Will readdress on an annual basis. 7. Health maintenance.  No  routine lab work done as patient does this elsewhere.  Follow-up 1 year, sooner as needed.   Dara Lords MD, 8:59 AM 05/30/2018

## 2018-06-11 MED FILL — SYNTHROID 75 MCG TABLET: 75 | 90 days supply | Qty: 90 | Fill #2

## 2018-06-18 ENCOUNTER — Other Ambulatory Visit: Payer: Self-pay | Admitting: Gynecology

## 2018-06-18 ENCOUNTER — Ambulatory Visit (INDEPENDENT_AMBULATORY_CARE_PROVIDER_SITE_OTHER): Payer: 59

## 2018-06-18 DIAGNOSIS — Z01419 Encounter for gynecological examination (general) (routine) without abnormal findings: Secondary | ICD-10-CM

## 2018-06-18 DIAGNOSIS — Z780 Asymptomatic menopausal state: Secondary | ICD-10-CM

## 2018-06-18 DIAGNOSIS — M8588 Other specified disorders of bone density and structure, other site: Secondary | ICD-10-CM

## 2018-06-18 DIAGNOSIS — Z13820 Encounter for screening for osteoporosis: Secondary | ICD-10-CM | POA: Diagnosis not present

## 2018-06-24 ENCOUNTER — Encounter: Payer: Self-pay | Admitting: Gynecology

## 2018-08-12 MED FILL — OLOPATADINE HCL 0.1% EYE DR: 0.1 | 25 days supply | Qty: 5 | Fill #1

## 2018-09-12 MED FILL — ZOLPIDEM TARTRATE 5 MG TAB: 5 | 30 days supply | Qty: 30 | Fill #1

## 2018-09-12 MED FILL — SYNTHROID 75 MCG TABLET: 75 | 90 days supply | Qty: 90 | Fill #3

## 2018-09-16 ENCOUNTER — Encounter (HOSPITAL_COMMUNITY): Payer: Self-pay | Admitting: *Deleted

## 2018-09-16 ENCOUNTER — Other Ambulatory Visit: Payer: Self-pay

## 2018-09-16 ENCOUNTER — Emergency Department (HOSPITAL_COMMUNITY)
Admission: EM | Admit: 2018-09-16 | Discharge: 2018-09-16 | Disposition: A | Discharge status: Home / Self Care | Payer: 59 | Attending: Emergency Medicine | Admitting: Emergency Medicine

## 2018-09-16 ENCOUNTER — Emergency Department (HOSPITAL_COMMUNITY): Payer: 59

## 2018-09-16 DIAGNOSIS — R51 Headache: Secondary | ICD-10-CM | POA: Diagnosis not present

## 2018-09-16 DIAGNOSIS — Z79899 Other long term (current) drug therapy: Secondary | ICD-10-CM | POA: Diagnosis not present

## 2018-09-16 DIAGNOSIS — R519 Headache, unspecified: Secondary | ICD-10-CM

## 2018-09-16 DIAGNOSIS — Z87891 Personal history of nicotine dependence: Secondary | ICD-10-CM | POA: Insufficient documentation

## 2018-09-16 NOTE — ED Triage Notes (Addendum)
PT reports head pain to posterior head with "weird side ways feeling" on her scalp that started today. Pt denies vision changes or speech changes. Pt asked if the Ambein  Could cause the feeling.

## 2018-09-16 NOTE — Discharge Instructions (Addendum)
You have been diagnosed today with Scalp Pain.  At this time there does not appear to be the presence of an emergent medical condition, however there is always the potential for conditions to change. Please read and follow the below instructions.  Please return to the Emergency Department immediately for any new or worsening symptoms or if your symptoms do not improve. Please be sure to follow up with your Primary Care Provider tomorrow regarding your visit today; please call their office to schedule an appointment even if you are feeling better for a follow-up visit. Your CT scan today shows arterial vascular calcifications and mucosal thickening of your sinuses be sure to discuss this with your primary care provider at your appointment tomorrow.  Get help right away if: Your headache becomes really bad. You keep throwing up. You have a stiff neck. You have trouble seeing. You have trouble speaking. You have pain in the eye or ear. Your muscles are weak or you lose muscle control. You lose your balance or have trouble walking. You feel like you will pass out (faint) or you pass out. You have confusion.  Please read the additional information packets attached to your discharge summary.  Do not take your medicine if  develop an itchy rash, swelling in your mouth or lips, or difficulty breathing.

## 2018-09-16 NOTE — ED Provider Notes (Signed)
[facility] EMERGENCY DEPARTMENT Provider Note   CSN: [id number] Arrival date & time: 09/16/18  1818     History   Chief Complaint Chief Complaint  Patient presents with  . Headache    HPI Anne Alexander is a 67 y.o. female presenting for painful bump to the occipital scalp that first appeared 1 week ago.  Patient states that she has noticed the bump intermittently primarily when she is lying down on the back of her head that disappears.  Patient states that today the bump and pain have been persistent.  Describes it as a 3/10 in severity mild throbbing pain to the skin.  Denies headache or pain that feels like it is coming from inside of her skull.  Patient states that she has not taken anything for pain and it is worsened with lying flat on the back of her head.  Patient denies headache, visual changes, dizziness, dysarthria or difficulty walking, fever, neck pain, color change or drainage.  Patient's husband is at bedside and states that she has been acting normally, no confusion or abnormal behavior.  HPI  Past Medical History:  Diagnosis Date  . Anxiety   . High cholesterol   . Memory change   . Osteopenia 06/2017   T score -2.1 FRAX 14% / 0.3%  . Sleep deficient   . Thyroid condition     Patient Active Problem List   Diagnosis Date Noted  . Memory change     Past Surgical History:  Procedure Laterality Date  . ABDOMINAL HYSTERECTOMY  age 57  . BLADDER SUSPENSION     Unknown type  . NECK SURGERY    . OOPHORECTOMY Bilateral age 30   endometriosis  . TONSILLECTOMY       OB History    Gravida  1   Para      Term      Preterm      AB  0   Living  1     SAB      TAB      Ectopic  0   Multiple      Live Births               Home Medications    Prior to Admission medications   Medication Sig Start Date End Date Taking? Authorizing Provider  BELSOMRA 20 MG TABS  12/11/16   [provider]  diclofenac  (VOLTAREN) 50 MG EC tablet Take 50 mg by mouth 2 (two) times daily. 04/19/17   [provider]  diphenhydrAMINE HCl (BENADRYL PO) Take by mouth.    [provider]  levothyroxine (SYNTHROID, LEVOTHROID) 75 MCG tablet Take 75 mcg by mouth daily before breakfast.    [provider]  Polyethylene Glycol 3350 (MIRALAX PO) Take by mouth as needed.    [provider]    Family History Family History  Problem Relation Age of Onset  . Dementia Mother   . Breast cancer Mother 89  . Alzheimer's disease Maternal Aunt   . Alzheimer's disease Maternal Grandfather   . Dementia Brother     Social History Social History   Tobacco Use  . Smoking status: Former Smoker    Packs/day: 1.00    Years: 20.00    Pack years: 20.00    Types: Cigarettes    Last attempt to quit: 03/24/2006    Years since quitting: 12.4  . Smokeless tobacco: Never Used  Substance Use Topics  . Alcohol use:  No  . Drug use: No     Allergies   Penicillins   Review of Systems Review of Systems  Constitutional: Negative.  Negative for chills and fever.  HENT: Negative.  Negative for rhinorrhea and sore throat.   Eyes: Negative.  Negative for visual disturbance.  Respiratory: Negative.  Negative for cough and shortness of breath.   Cardiovascular: Negative.  Negative for chest pain.  Gastrointestinal: Negative.  Negative for abdominal pain, nausea and vomiting.  Genitourinary: Negative.  Negative for dysuria and hematuria.  Musculoskeletal: Negative.  Negative for arthralgias and myalgias.  Skin: Negative for rash.       Bump/pain to occipital scalp  Neurological: Negative.  Negative for dizziness, syncope, speech difficulty, weakness, light-headedness, numbness and headaches.   Physical Exam Updated Vital Signs BP (!) 173/86 (BP Location: Right Arm)   Pulse 77   Temp 97.9 F (36.6 C) (Oral)   Resp 16   Ht 5\' 5"  (1.651 m)   Wt 90.7 kg   SpO2 98%   BMI 33.28 kg/m    Physical Exam  Constitutional: She is oriented to person, place, and time. She appears well-developed and well-nourished. She does not appear ill. No distress.  HENT:  Head: Normocephalic and atraumatic. Head is without raccoon's eyes, without Battle's sign, without abrasion and without contusion. Hair is normal.    Right Ear: Hearing, tympanic membrane, external ear and ear canal normal.  Left Ear: Hearing, tympanic membrane, external ear and ear canal normal.  Nose: Nose normal.  Mouth/Throat: Uvula is midline, oropharynx is clear and moist and mucous membranes are normal.  Area on chart indicates were patient states that she feels her bump/pain.   No abnormality to the scalp on my evaluation.  Eyes: Pupils are equal, round, and reactive to light. Conjunctivae and EOM are normal.  Neck: Trachea normal, normal range of motion, full passive range of motion without pain and phonation normal. Neck supple. No tracheal deviation present.  Cardiovascular: Normal rate, regular rhythm, normal heart sounds and intact distal pulses.  Pulmonary/Chest: Effort normal and breath sounds normal. No respiratory distress.  Abdominal: Soft. Bowel sounds are normal. There is no tenderness. There is no rebound and no guarding.  Musculoskeletal: Normal range of motion.  Neurological: She is alert and oriented to person, place, and time. GCS eye subscore is 4. GCS verbal subscore is 5. GCS motor subscore is 6.  Mental Status: Alert, oriented, thought content appropriate, able to give a coherent history. Speech fluent without evidence of aphasia. Able to follow 2 step commands without difficulty. Cranial Nerves: II: Peripheral visual fields grossly normal, pupils equal, round, reactive to light III,IV, VI: ptosis not present, extra-ocular motions intact bilaterally V,VII: smile symmetric, eyebrows raise symmetric, facial light touch sensation equal VIII: hearing grossly normal to voice X: uvula  elevates symmetrically XI: bilateral shoulder shrug symmetric and strong XII: midline tongue extension without fassiculations Motor: Normal tone. 5/5 strength in upper and lower extremities bilaterally including strong and equal grip strength and dorsiflexion/plantar flexion Sensory: Sensation intact to light touch in all extremities.Negative Romberg.  Cerebellar: normal finger-to-nose with bilateral upper extremities. Normal heel-to -shin balance bilaterally of the lower extremity. No pronator drift.  Gait: normal gait and balance CV: distal pulses palpable throughout  Skin: Skin is warm, dry and intact. Capillary refill takes less than 2 seconds. No abrasion, no bruising, no ecchymosis and no rash noted. No erythema.  Psychiatric: She has a normal mood and affect. Her behavior is normal.  ED Treatments / Results  Labs (all labs ordered are listed, but only abnormal results are displayed) Labs Reviewed - No data to display  EKG None  Radiology Ct Head Wo Contrast  Result Date: 09/16/2018 CLINICAL DATA:  Headache for approximately 1 week EXAM: CT HEAD WITHOUT CONTRAST TECHNIQUE: Contiguous axial images were obtained from the base of the skull through the vertex without intravenous contrast. COMPARISON:  None. FINDINGS: Brain: Ventricles are normal in size and configuration. There is no intracranial mass, hemorrhage, extra-axial fluid collection, or midline shift. Brain parenchyma appears unremarkable. No evident acute infarct. Vascular: No appreciable hyperdense vessel. There is calcification in each carotid siphon region. Skull: The bony calvarium appears intact. Sinuses/Orbits: There is mucosal thickening in several ethmoid air cells. Visualized paranasal sinuses elsewhere are clear. Orbits appear symmetric bilaterally. Other: Mastoid air cells are clear. IMPRESSION: No intracranial mass or hemorrhage. Brain parenchyma appears unremarkable. There are foci of arterial vascular  calcification. There is mucosal thickening in several ethmoid air cells. Electronically Signed   By: Bretta Bang III M.D.   On: 09/16/2018 19:35    Procedures Procedures (including critical care time)  Medications Ordered in ED Medications - No data to display   Initial Impression / Assessment and Plan / ED Course  I have reviewed the triage vital signs and the nursing notes.  Pertinent labs & imaging results that were available during my care of the patient were reviewed by me and considered in my medical decision making (see chart for details).  Clinical Course as of Sep 17 2039  Mon Sep 16, 2018  1324 Seen and evaluated by Dr. Hyacinth Meeker, ordered CT head, pending normal discharge with PCP follow-up.   [BM]    Clinical Course User Index [BM] Bill Salinas, PA-C   67 year old female presenting today for bump/pain to posterior scalp, no history of trauma.  On evaluation today I am unable to locate any abnormality to the occipital scalp.  Normal neurologic exam today and no neurologic complaint or headache/visual changes.  Patient without history of fever, stiff neck or any other pain/symptoms.  Patient was discussed with and patient seen by Dr. Hyacinth Meeker who is also unable to find any abnormality to the occipital scalp.  CT head was obtained which did not show any acute abnormality today.  Patient was informed of incidental findings of arterial vascular calcifications and mucosal thickening of the ethmoid air cells.  Patient informed to speak to her primary care provider about this tomorrow.  Patient has an appointment with her primary care provider tomorrow and states that she plans to keep this appointment.  Patient's husband at bedside cooperates patient's story, does not appear altered from baseline and is acting her normal self.  Patient is afebrile, nontoxic, not tachycardic, not hypotensive well-appearing and in no acute distress.  No neurologic abnormalities and normal CT head.   No signs of cellulitis/abscess, foreign body or trauma.  Case was discussed with Dr. Hyacinth Meeker who agrees that patient can be discharged at this time with PCP follow-up tomorrow morning.  At this time there does not appear to be any evidence of an acute emergency medical condition and the patient appears stable for discharge with appropriate outpatient follow up. Diagnosis was discussed with patient who verbalizes understanding of care plan and is agreeable to discharge. I have discussed return precautions with patient and husband at bedside who verbalize understanding of return precautions. Patient strongly encouraged to follow-up with their PCP tomorrow. All questions answered.  Note: Portions  of this report may have been transcribed using voice recognition software. Every effort was made to ensure accuracy; however, inadvertent computerized transcription errors may still be present.  Final Clinical Impressions(s) / ED Diagnoses   Final diagnoses:  Scalp pain    ED Discharge Orders    None       Elizabeth Palau 09/16/18 2045    Eber Hong, MD 09/17/18 [phone]

## 2018-09-16 NOTE — ED Provider Notes (Signed)
Medical screening examination/treatment/procedure(s) were conducted as a shared visit with non-physician practitioner(s) and myself.  I personally evaluated the patient during the encounter.  Clinical Impression:   Final diagnoses:  Scalp pain    The patient is a 67 year old female who presents with a abnormal feeling on the posterior scalp.  She does not feel a mass, states that it may feel better when there is pressure on this area but worse when it is not, there does feel like there is a radiating fullness across the back of the scalp.  She states she cannot make it worse with palpation.  There is no fever, no neurologic symptoms, no stiff neck and on exam the patient is well-appearing without focal tenderness on the back of the scalp.  She is concerned that she may have something intracranial, I do not see a focal neurologic deficit, the patient is extremely anxious about this and I think it is reasonable to order a CT scan for further evaluation of this process which is been going on for 1 week and gradually worsening.   Eber Hong, MD 09/17/18 [phone]

## 2018-09-17 DIAGNOSIS — F5104 Psychophysiologic insomnia: Secondary | ICD-10-CM | POA: Diagnosis not present

## 2018-09-17 DIAGNOSIS — Z6832 Body mass index (BMI) 32.0-32.9, adult: Secondary | ICD-10-CM | POA: Diagnosis not present

## 2018-09-17 DIAGNOSIS — M5412 Radiculopathy, cervical region: Secondary | ICD-10-CM | POA: Diagnosis not present

## 2018-09-17 MED FILL — CYCLOBENZAPRINE 10 MG TAB: 10 | 20 days supply | Qty: 60 | Fill #0

## 2018-09-27 DIAGNOSIS — M5030 Other cervical disc degeneration, unspecified cervical region: Secondary | ICD-10-CM | POA: Diagnosis not present

## 2018-09-27 DIAGNOSIS — M5412 Radiculopathy, cervical region: Secondary | ICD-10-CM | POA: Diagnosis not present

## 2018-09-27 DIAGNOSIS — M542 Cervicalgia: Secondary | ICD-10-CM | POA: Diagnosis not present

## 2018-09-27 DIAGNOSIS — M4722 Other spondylosis with radiculopathy, cervical region: Secondary | ICD-10-CM | POA: Diagnosis not present

## 2018-09-27 DIAGNOSIS — R51 Headache: Secondary | ICD-10-CM | POA: Diagnosis not present

## 2018-10-14 MED FILL — CYCLOBENZAPRINE 10 MG TAB: 10 | 20 days supply | Qty: 60 | Fill #1

## 2018-10-18 ENCOUNTER — Other Ambulatory Visit: Payer: Self-pay

## 2018-10-18 ENCOUNTER — Ambulatory Visit: Payer: 59 | Attending: Neurosurgery | Admitting: Physical Therapy

## 2018-10-18 DIAGNOSIS — M542 Cervicalgia: Secondary | ICD-10-CM | POA: Insufficient documentation

## 2018-10-18 NOTE — Patient Instructions (Signed)
Access Code: [email or web address]  URL: [email or web address]  Date: 10/18/2018  Prepared by: Raynelle FanningJulie Sariah Henkin   Exercises  Seated Upper Trapezius Stretch - 2 reps - 1 sets - 30 sec hold - 2x daily - 7x weekly  Seated Levator Scapulae Stretch - 2 reps - 1 sets - 30 sec hold - 2x daily - 7x weekly  Seated Cervical Retraction - 10 reps - 1-2 sets - 3-5 sec hold - 3x daily - 7x weekly  Doorway Pec Stretch at 90 Degrees Abduction - 10 reps - 3 sets - 1x daily - 7x weekly  Doorway Pec Stretch at 90 Degrees Abduction - 10 reps - 3 sets - 1x daily - 7x weekly  Doorway Pec Stretch at 90 Degrees Abduction - 3 reps - 1 sets - 30 sec hold - 2x daily - 7x weekly    Solon PalmJulie Numa Schroeter, PT 10/18/18 9:23 AM

## 2018-10-18 NOTE — Therapy (Signed)
[facility] [address] [place], [place], [address] Phone: [phone]   Fax:  [phone]  Physical Therapy Evaluation  Patient Details  Name: Anne Alexander MRN: [id number] Date of Birth: 1950/12/17 Referring Provider (PT): Newell Coral   Encounter Date: 10/18/2018  PT End of Session - 10/18/18 0845    Visit Number  1    Date for PT Re-Evaluation  12/13/18    PT Start Time  0845    PT Stop Time  0924    PT Time Calculation (min)  39 min    Activity Tolerance  Patient tolerated treatment well    Behavior During Therapy  [facility] for tasks assessed/performed       Past Medical History:  Diagnosis Date  . Anxiety   . High cholesterol   . Memory change   . Osteopenia 06/2017   T score -2.1 FRAX 14% / 0.3%  . Sleep deficient   . Thyroid condition     Past Surgical History:  Procedure Laterality Date  . ABDOMINAL HYSTERECTOMY  age 37  . BLADDER SUSPENSION     Unknown type  . NECK SURGERY    . OOPHORECTOMY Bilateral age 76   endometriosis  . TONSILLECTOMY      There were no vitals filed for this visit.   Subjective Assessment - 10/18/18 0846    Subjective  Patient was getting a pain in her head originally, but then she realized that her work environment and her food prep may be causing the tension in her upper traps that goes into her head.     Pertinent History  cervical fusion, osteopenia, hiatal hernia    Diagnostic tests  xrays - normal; CT fine    Patient Stated Goals  to get rid of pain    Currently in Pain?  Yes    Pain Score  2     Pain Location  Neck    Pain Orientation  Right;Left;Upper    Pain Descriptors / Indicators  Burning;Dull    Pain Type  Acute pain    Pain Radiating Towards  head and upper traps    Pain Onset  1 to 4 weeks ago    Pain Frequency  Intermittent    Aggravating Factors   repetitive head down work    Pain Relieving Factors  stretching or meds    Effect of Pain on Daily  Activities  hard to work with pain         [facility] PT Assessment - 10/18/18 0001      Assessment   Medical Diagnosis  cervical spondylosis    Referring Provider (PT)  Nudelman    Onset Date/Surgical Date  11/15/18    Hand Dominance  Right    Next MD Visit  January    Prior Therapy  no      Precautions   Precautions  Cervical    Precaution Comments  fusion       Balance Screen   Has the patient fallen in the past 6 months  No    Has the patient had a decrease in activity level because of a fear of falling?   No    Is the patient reluctant to leave their home because of a fear of falling?   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              PT Education - 10/18/18 [phone]    Education Details  HEP    Person(s) Educated  Patient    Methods  Explanation;Demonstration;Handout    Comprehension  Verbalized understanding;Returned demonstration       PT Short Term Goals - 10/18/18 1132      PT SHORT TERM GOAL #1   Title  Ind with initial HEP    Time  2    Period  Weeks    Status  New    Target Date  11/01/18        PT Long Term Goals - 10/18/18 1132      PT LONG TERM GOAL #1   Title   Patient able to perform ADLs and work functions with 75% less pain.    Time  8    Period  Weeks    Status  New    Target Date  12/13/18      PT LONG TERM GOAL #2   Title  Patient to demo improved cervical left rotation and right SB by 10 deg to normalize function    Baseline  IE: left rot 47 deg; right SB 16 deg    Time  8    Period  Weeks    Status  New      PT LONG TERM GOAL #3   Title  Patient to be independent with advanced HEP for postural strengthening to prevent further pain.    Time  8    Period  Weeks    Status  New             Plan - 10/18/18 0935    Clinical Impression Statement  Patient presents with complaints of neck and head pain beginning about 4 weeks ago. 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 She has decreased ROM and strength in the neck and increased tone in UT and subocciptals. She has had a cervical fusion in the past around C4.     History and Personal Factors relevant to plan of care:  osteopenia    Clinical Presentation  Stable    Clinical Decision Making  Low    Rehab Potential  Excellent    PT Frequency  2x / week    PT Duration  8 weeks    PT Treatment/Interventions  ADLs/Self Care Home Management;Cryotherapy;Electrical Stimulation;Moist Heat;Ultrasound;Therapeutic exercise;Patient/family education;Neuromuscular re-education;Manual  techniques;Dry needling;Taping    PT Next Visit Plan  DN/STW to bil UT/levator; suboccipital release, neck flexibility; postural strengthening; modalities prn    PT Home Exercise Plan  Access Code: [email or web address]     Consulted and Agree with Plan of Care  Patient       Patient will benefit from skilled therapeutic intervention in order to improve the following deficits and impairments:  Pain, Postural dysfunction, Decreased range of motion, Decreased strength, Impaired flexibility  Visit Diagnosis: Cervicalgia - Plan: PT plan of care  cert/re-cert     Problem List Patient Active Problem List   Diagnosis Date Noted  . Memory change     Solon Palm PT 10/18/2018, 11:39 AM  [facility] [address] [place], [place], [address] Phone: [phone]   Fax:  [phone]  Name: YOSHIE KOSEL MRN: [id number] Date of Birth: [birth year]

## 2018-10-21 ENCOUNTER — Ambulatory Visit: Payer: 59 | Admitting: Physical Therapy

## 2018-10-21 ENCOUNTER — Encounter: Payer: Self-pay | Admitting: Physical Therapy

## 2018-10-21 DIAGNOSIS — M542 Cervicalgia: Secondary | ICD-10-CM

## 2018-10-21 NOTE — Therapy (Signed)
[facility] [address] [place], [place], [address] Phone: [phone]   Fax:  [phone]  Physical Therapy Treatment  Patient Details  Name: Anne MapleBeverly A Alexander MRN: [id number] Date of Birth: February 07, 1951 Referring Provider (PT): Newell CoralNudelman   Encounter Date: 10/21/2018  PT End of Session - 10/21/18 1553    Visit Number  2    Date for PT Re-Evaluation  12/13/18    PT Start Time  1507    PT Stop Time  1557    PT Time Calculation (min)  50 min    Activity Tolerance  Patient tolerated treatment well    Behavior During Therapy  [facility] for tasks assessed/performed       Past Medical History:  Diagnosis Date  . Anxiety   . High cholesterol   . Memory change   . Osteopenia 06/2017   T score -2.1 FRAX 14% / 0.3%  . Sleep deficient   . Thyroid condition     Past Surgical History:  Procedure Laterality Date  . ABDOMINAL HYSTERECTOMY  age 67  . BLADDER SUSPENSION     Unknown type  . NECK SURGERY    . OOPHORECTOMY Bilateral age 67   endometriosis  . TONSILLECTOMY      There were no vitals filed for this visit.  Subjective Assessment - 10/21/18 1509    Subjective  "I am feeling ok"     Currently in Pain?  Yes    Pain Score  2     Pain Location  Shoulder    Pain Orientation  Right;Left                       [facility] Adult PT Treatment/Exercise - 10/21/18 0001      Exercises   Exercises  Neck;Shoulder      Neck Exercises: Machines for Strengthening   Cybex Row  20lb 2x10    Lat Pull  20lb 2x10       Neck Exercises: Standing   Wall Push Ups  20 reps      Neck Exercises: Supine   Neck Retraction  10 reps;3 secs      Shoulder Exercises: Standing   Extension  Theraband;20 reps;Both    Theraband Level (Shoulder Extension)  Level 2 (Red)    Row  Theraband;20 reps;Both    Theraband Level (Shoulder Row)  Level 2 (Red)      Shoulder Exercises: ROM/Strengthening   UBE (Upper Arm Bike)  L2  2 min  each way       Modalities   Modalities  Moist Heat      Moist Heat Therapy   Number Minutes Moist Heat  10 Minutes    Moist Heat Location  Cervical      Manual Therapy   Manual Therapy  Soft tissue mobilization;Manual Traction;Passive ROM    Soft tissue mobilization  posterior cervical paraspinales, levater, upper traps.     Passive ROM  cervical spine     Manual Traction  4x20''               PT Short Term Goals - 10/18/18 1132      PT SHORT TERM GOAL #1   Title  Ind with initial HEP    Time  2    Period  Weeks    Status  New    Target Date  11/01/18        PT Long Term Goals - 10/18/18  1132      PT LONG TERM GOAL #1   Title  Patient able to perform ADLs and work functions with 75% less pain.    Time  8    Period  Weeks    Status  New    Target Date  12/13/18      PT LONG TERM GOAL #2   Title  Patient to demo improved cervical left rotation and right SB by 10 deg to normalize function    Baseline  IE: left rot 47 deg; right SB 16 deg    Time  8    Period  Weeks    Status  New      PT LONG TERM GOAL #3   Title  Patient to be independent with advanced HEP for postural strengthening to prevent further pain.    Time  8    Period  Weeks    Status  New            Plan - 10/21/18 1558    Clinical Impression Statement  Pt tolerated an initial progression to TE well. Pt maintain good posture with all exercises. Postural cues needed with standing shoulder extensions. She did report some neck tightness with shrugs. Positive response to MT and moist heat.    Rehab Potential  Excellent    PT Next Visit Plan  DN/STW to bil UT/levator; suboccipital release, neck flexibility; postural strengthening; modalities prn       Patient will benefit from skilled therapeutic intervention in order to improve the following deficits and impairments:  Pain, Postural dysfunction, Decreased range of motion, Decreased strength, Impaired flexibility  Visit  Diagnosis: Cervicalgia     Problem List Patient Active Problem List   Diagnosis Date Noted  . Memory change     Grayce Sessions, PTA 10/21/2018, 3:59 PM  [facility] [place] [facility] [address] [place], [place], [address] Phone: [phone]   Fax:  [phone]  Name: Anne Alexander MRN: [id number] Date of Birth: Mar 30, 1951

## 2018-10-23 ENCOUNTER — Encounter: Payer: Self-pay | Admitting: Physical Therapy

## 2018-10-23 ENCOUNTER — Ambulatory Visit: Payer: 59 | Admitting: Physical Therapy

## 2018-10-23 DIAGNOSIS — M542 Cervicalgia: Secondary | ICD-10-CM

## 2018-10-23 NOTE — Therapy (Signed)
[address] [place], [place], [address] Phone: [phone]   Fax:  [phone]  Physical Therapy Treatment  Patient Details  Name: Anne Alexander MRN: [id number] Date of Birth: 08/17/1951 Referring Provider (PT): Sherwood Gambler   Encounter Date: 10/23/2018  PT End of Session - 10/23/18 1157    Visit Number  3    Date for PT Re-Evaluation  12/13/18    PT Start Time  2620    PT Stop Time  1609    PT Time Calculation (min)  54 min       Past Medical History:  Diagnosis Date  . Anxiety   . High cholesterol   . Memory change   . Osteopenia 06/2017   T score -2.1 FRAX 14% / 0.3%  . Sleep deficient   . Thyroid condition     Past Surgical History:  Procedure Laterality Date  . ABDOMINAL HYSTERECTOMY  age 61  . BLADDER SUSPENSION     Unknown type  . NECK SURGERY    . OOPHORECTOMY Bilateral age 22   endometriosis  . TONSILLECTOMY      There were no vitals filed for this visit.  Subjective Assessment - 10/23/18 1518    Subjective  Pt reports some soreness and pain yesterday from exercising. Today's reports some soreness in her shoulders.    Currently in Pain?  Yes    Pain Score  4     Pain Location  Shoulder    Pain Orientation  Left                       [facility] Adult PT Treatment/Exercise - 10/23/18 0001      Neck Exercises: Machines for Strengthening   Nustep  L6 x 4 min       Shoulder Exercises: Standing   Extension  Theraband;20 reps;Both    Theraband Level (Shoulder Extension)  Level 1 (Yellow)    Row  Theraband;20 reps;Both    Theraband Level (Shoulder Row)  Level 1 (Yellow)      Modalities   Modalities  Moist Heat      Moist Heat Therapy   Number Minutes Moist Heat  15 Minutes    Moist Heat Location  Cervical      Manual Therapy   Manual Therapy  Soft tissue mobilization;Manual Traction;Passive ROM    Soft tissue mobilization  posterior cervical paraspinales,  levater, upper traps, rhomboids               PT Short Term Goals - 10/23/18 1606      PT SHORT TERM GOAL #1   Title  Ind with initial HEP    Status  Achieved        PT Long Term Goals - 10/23/18 1606      PT LONG TERM GOAL #1   Title  Patient able to perform ADLs and work functions with 75% less pain.    Status  Partially Met            Plan - 10/23/18 1555    Clinical Impression Statement  Pt reports some soreness the following day after last treatment session. Due to pt complaints all interventions were done with less resistance. She reports soreness in the medial boarder or R scapula, this  intensified with palpation. Positive response to MT evident but pt subjective reports of decrease pain with movement.    Rehab Potential  Excellent  PT Frequency  2x / week    PT Duration  8 weeks    PT Treatment/Interventions  ADLs/Self Care Home Management;Cryotherapy;Electrical Stimulation;Moist Heat;Ultrasound;Therapeutic exercise;Patient/family education;Neuromuscular re-education;Manual techniques;Dry needling;Taping    PT Next Visit Plan  DN/STW to bil UT/levator; suboccipital release, neck flexibility; postural strengthening; modalities prn       Patient will benefit from skilled therapeutic intervention in order to improve the following deficits and impairments:  Difficulty walking  Visit Diagnosis: Cervicalgia     Problem List Patient Active Problem List   Diagnosis Date Noted  . Memory change     Scot Jun, PTA 10/23/2018, 4:07 PM  [place], [place], [address] Phone: [phone]   Fax:  [phone]  Name: JAQUAY MORNEAULT MRN: [id number] Date of Birth: 1951/09/18

## 2018-10-25 DIAGNOSIS — R82998 Other abnormal findings in urine: Secondary | ICD-10-CM | POA: Diagnosis not present

## 2018-10-25 DIAGNOSIS — Z0000 Encounter for general adult medical examination without abnormal findings: Secondary | ICD-10-CM | POA: Diagnosis not present

## 2018-10-25 DIAGNOSIS — E038 Other specified hypothyroidism: Secondary | ICD-10-CM | POA: Diagnosis not present

## 2018-10-25 MED FILL — DICLOFENAC SOD EC 50 MG TAB: 50 | 45 days supply | Qty: 90 | Fill #1

## 2018-10-25 MED FILL — OLOPATADINE HCL 0.1% EYE DR: 0.1 | 25 days supply | Qty: 5 | Fill #2

## 2018-10-31 ENCOUNTER — Ambulatory Visit: Payer: 59 | Admitting: Physical Therapy

## 2018-10-31 DIAGNOSIS — M542 Cervicalgia: Secondary | ICD-10-CM | POA: Diagnosis not present

## 2018-10-31 NOTE — Therapy (Signed)
[address] [place], [place], [address] Phone: [phone]   Fax:  [phone]  Physical Therapy Treatment  Patient Details  Name: Anne Alexander MRN: [id number] Date of Birth: 10/12/1951 Referring Provider (PT): Sherwood Gambler   Encounter Date: 10/31/2018  PT End of Session - 10/31/18 1550    Visit Number  4    Date for PT Re-Evaluation  12/13/18    PT Start Time  2694    PT Stop Time  1605    PT Time Calculation (min)  50 min       Past Medical History:  Diagnosis Date  . Anxiety   . High cholesterol   . Memory change   . Osteopenia 06/2017   T score -2.1 FRAX 14% / 0.3%  . Sleep deficient   . Thyroid condition     Past Surgical History:  Procedure Laterality Date  . ABDOMINAL HYSTERECTOMY  age 27  . BLADDER SUSPENSION     Unknown type  . NECK SURGERY    . OOPHORECTOMY Bilateral age 44   endometriosis  . TONSILLECTOMY      There were no vitals filed for this visit.  Subjective Assessment - 10/31/18 1522    Subjective  neck is better , shld ( pointing to traps) are tight and increase with activitiy    Currently in Pain?  Yes    Pain Score  2     Pain Location  Neck                       [facility] Adult PT Treatment/Exercise - 10/31/18 0001      Neck Exercises: Machines for Strengthening   UBE (Upper Arm Bike)  L 3 3 fwd/3 back      Neck Exercises: Theraband   Scapula Retraction  15 reps;Red    Shoulder Extension  15 reps;Red    Shoulder ADduction  15 reps;Red    Shoulder External Rotation  15 reps;Red      Neck Exercises: Standing   Neck Retraction  15 reps;3 secs   head on ball   Wall Push Ups  10 reps   5 times CC and CCW   Other Standing Exercises  5# shruggs and backward rolls 15 each      Modalities   Modalities  Electrical Stimulation;Moist Heat      Moist Heat Therapy   Number Minutes Moist Heat  15 Minutes    Moist Heat Location  Cervical      Electrical Stimulation   Electrical Stimulation Location  traps    Electrical Stimulation Action  IFC    Electrical Stimulation Parameters  seated    Electrical Stimulation Goals  Pain      Manual Therapy   Manual Therapy  Soft tissue mobilization    Soft tissue mobilization  traps/cerv/rhom             PT Education - 10/31/18 1549    Education Details  TENS unit on [employer]. shruggs and rolls every hour to help keep traps relaxed    Person(s) Educated  Patient    Methods  Explanation;Handout    Comprehension  Verbalized understanding;Returned demonstration       PT Short Term Goals - 10/23/18 1606      PT SHORT TERM GOAL #1   Title  Ind with initial HEP    Status  Achieved        PT Long  Term Goals - 10/31/18 1549      PT LONG TERM GOAL #1   Title  Patient able to perform ADLs and work functions with 75% less pain.    Status  Partially Met      PT LONG TERM GOAL #2   Title  Patient to demo improved cervical left rotation and right SB by 10 deg to normalize function    Status  Partially Met      PT LONG TERM GOAL #3   Title  Patient to be independent with advanced HEP for postural strengthening to prevent further pain.    Status  Partially Met            Plan - 10/31/18 1550    Clinical Impression Statement  pt tolerated ther ex well but did c/o fatigue and some increased trap pain. tenderness and tightness in traps RT >Left.     PT Treatment/Interventions  ADLs/Self Care Home Management;Cryotherapy;Electrical Stimulation;Moist Heat;Ultrasound;Therapeutic exercise;Patient/family education;Neuromuscular re-education;Manual techniques;Dry needling;Taping    PT Next Visit Plan  DN/STW to bil UT/levator; suboccipital release, neck flexibility; postural strengthening; modalities prn       Patient will benefit from skilled therapeutic intervention in order to improve the following deficits and impairments:     Visit Diagnosis: Cervicalgia     Problem  List Patient Active Problem List   Diagnosis Date Noted  . Memory change     PAYSEUR,ANGIE PTA 10/31/2018, 3:51 PM  [place] [address] [place], [place], [address] Phone: [phone]   Fax:  [phone]  Name: Anne Alexander MRN: [id number] Date of Birth: [birth year]

## 2018-11-01 DIAGNOSIS — Z1212 Encounter for screening for malignant neoplasm of rectum: Secondary | ICD-10-CM | POA: Diagnosis not present

## 2018-11-07 ENCOUNTER — Ambulatory Visit: Payer: 59 | Attending: Neurosurgery | Admitting: Physical Therapy

## 2018-11-07 DIAGNOSIS — M542 Cervicalgia: Secondary | ICD-10-CM | POA: Diagnosis present

## 2018-11-07 NOTE — Therapy (Addendum)
[place] [address] [place], [place], [address] Phone: [phone]   Fax:  [phone]  Physical Therapy Treatment  Patient Details  Name: Anne Alexander MRN: [id number] Date of Birth: 1951-05-27 Referring Provider (PT): Sherwood Gambler   Encounter Date: 11/07/2018  PT End of Session - 11/07/18 9476    Visit Number  5    Date for PT Re-Evaluation  12/13/18    PT Start Time  1520    PT Stop Time  1610    PT Time Calculation (min)  50 min       Past Medical History:  Diagnosis Date  . Anxiety   . High cholesterol   . Memory change   . Osteopenia 06/2017   T score -2.1 FRAX 14% / 0.3%  . Sleep deficient   . Thyroid condition     Past Surgical History:  Procedure Laterality Date  . ABDOMINAL HYSTERECTOMY  age 80  . BLADDER SUSPENSION     Unknown type  . NECK SURGERY    . OOPHORECTOMY Bilateral age 42   endometriosis  . TONSILLECTOMY      There were no vitals filed for this visit.  Subjective Assessment - 11/07/18 1525    Subjective  its just the shoulders with work and ADLs. I got a PN shot today. I got a TENS, I think I can use it just show me where to put electrodes.    Currently in Pain?  Yes    Pain Score  2     Pain Location  Neck                       [facility] Adult PT Treatment/Exercise - 11/07/18 0001      Neck Exercises: Machines for Strengthening   UBE (Upper Arm Bike)  L 3 3 fwd/3 back    Cybex Row  25# 2 sets 15    Lat Pull  25# 2 sets 15      Neck Exercises: Standing   Neck Retraction  15 reps;3 secs    Wall Push Ups  10 reps   CC and CCW 5 each     Modalities   Modalities  Electrical Stimulation;Moist Heat      Moist Heat Therapy   Number Minutes Moist Heat  15 Minutes    Moist Heat Location  Cervical      Electrical Stimulation   Electrical Stimulation Location  traps    Electrical Stimulation Action  IFC    Electrical Stimulation Parameters  seated    Electrical Stimulation Goals  Pain             PT Education - 11/07/18 1538    Education Details  green scap stab    Person(s) Educated  Patient    Methods  Explanation;Demonstration;Handout    Comprehension  Verbalized understanding;Returned demonstration       PT Short Term Goals - 10/23/18 1606      PT SHORT TERM GOAL #1   Title  Ind with initial HEP    Status  Achieved        PT Long Term Goals - 11/07/18 1546      PT LONG TERM GOAL #1   Title  Patient able to perform ADLs and work functions with 75% less pain.    Status  Partially Met      PT LONG TERM GOAL #2   Title  Patient to demo improved  cervical left rotation and right SB by 10 deg to normalize function    Status  Achieved      PT LONG TERM GOAL #3   Title  Patient to be independent with advanced HEP for postural strengthening to prevent further pain.    Status  Partially Met            Plan - 11/07/18 1555    Clinical Impression Statement  progressing with goals. educ on TENS use and safety. issued HEP som ecuing needed    PT Treatment/Interventions  ADLs/Self Care Home Management;Cryotherapy;Electrical Stimulation;Moist Heat;Ultrasound;Therapeutic exercise;Patient/family education;Neuromuscular re-education;Manual techniques;Dry needling;Taping    PT Next Visit Plan  assess HEP and TENS and progress       Patient will benefit from skilled therapeutic intervention in order to improve the following deficits and impairments:  Difficulty walking  Visit Diagnosis: Cervicalgia     Problem List Patient Active Problem List   Diagnosis Date Noted  . Memory change    PHYSICAL THERAPY DISCHARGE SUMMARY  Visits from Start of Care: 5 Plan: Patient agrees to discharge.  Patient goals were partially met. Patient is being discharged due to financial reasons.  ?????      PAYSEUR,ANGIE PTA 11/07/2018, 3:56 PM  [place] [address] [place], [place], [address] Phone: [phone]   Fax:  [phone]  Name: Anne Alexander MRN: [id number] Date of Birth: [birth year]

## 2018-11-11 ENCOUNTER — Encounter: Payer: 59 | Admitting: Physical Therapy

## 2018-11-13 ENCOUNTER — Ambulatory Visit: Payer: 59 | Admitting: Physical Therapy

## 2018-11-18 ENCOUNTER — Ambulatory Visit: Payer: 59 | Admitting: Physical Therapy

## 2018-11-20 ENCOUNTER — Ambulatory Visit: Payer: 59 | Admitting: Physical Therapy

## 2019-03-31 ENCOUNTER — Encounter: Payer: Self-pay | Admitting: Gastroenterology

## 2019-06-04 ENCOUNTER — Encounter: Payer: 59 | Admitting: Gynecology

## 2019-06-06 ENCOUNTER — Other Ambulatory Visit: Payer: Self-pay | Admitting: Internal Medicine

## 2019-08-13 ENCOUNTER — Encounter: Payer: Self-pay | Admitting: Gynecology

## 2019-08-18 ENCOUNTER — Other Ambulatory Visit: Payer: Self-pay

## 2019-08-19 ENCOUNTER — Ambulatory Visit (INDEPENDENT_AMBULATORY_CARE_PROVIDER_SITE_OTHER): Payer: 59 | Admitting: Gynecology

## 2019-08-19 ENCOUNTER — Encounter: Payer: Self-pay | Admitting: Gynecology

## 2019-08-19 ENCOUNTER — Telehealth: Payer: Self-pay | Admitting: *Deleted

## 2019-08-19 VITALS — BP 124/80 | Ht 65.0 in | Wt 208.0 lb

## 2019-08-19 DIAGNOSIS — N952 Postmenopausal atrophic vaginitis: Secondary | ICD-10-CM | POA: Diagnosis not present

## 2019-08-19 DIAGNOSIS — Z01419 Encounter for gynecological examination (general) (routine) without abnormal findings: Secondary | ICD-10-CM

## 2019-08-19 DIAGNOSIS — N8189 Other female genital prolapse: Secondary | ICD-10-CM

## 2019-08-19 DIAGNOSIS — M8580 Other specified disorders of bone density and structure, unspecified site: Secondary | ICD-10-CM | POA: Diagnosis not present

## 2019-08-19 DIAGNOSIS — N644 Mastodynia: Secondary | ICD-10-CM

## 2019-08-19 NOTE — Telephone Encounter (Signed)
Patient scheduled at breast center on 08/21/19 @ 8:30am, left message for patient to call.

## 2019-08-19 NOTE — Progress Notes (Signed)
    Anne Alexander 1950/11/12 [id number]        68 y.o.  G1P0001 for annual gynecologic exam.  Several issues noted below.  Past medical history,surgical history, problem list, medications, allergies, family history and social history were all reviewed and documented as reviewed in the EPIC chart.  ROS:  Performed with pertinent positives and negatives included in the history, assessment and plan.   Additional significant findings : None   Exam: Caryn Bee assistant Vitals:   08/19/19 0757  BP: 124/80  Weight: 208 lb (94.3 kg)  Height: 5\' 5"  (1.651 m)   Body mass index is 34.61 kg/m.  General appearance:  Normal affect, orientation and appearance. Skin: Grossly normal HEENT: Without gross lesions.  No cervical or supraclavicular adenopathy. Thyroid normal.  Lungs:  Clear without wheezing, rales or rhonchi Cardiac: RR, without RMG Abdominal:  Soft, nontender, without masses, guarding, rebound, organomegaly or hernia Breasts:  Examined lying and sitting without masses, retractions, discharge or axillary adenopathy. Pelvic:  Ext, BUS, Vagina: With atrophic changes.  First-degree cystocele.  Second-degree rectocele.  Cuff well supported  Adnexa: Without masses or tenderness    Anus and perineum: Normal   Rectovaginal: Normal sphincter tone without palpated masses or tenderness.    Assessment/Plan:  68 y.o. G1P0001 female for annual gynecologic exam.  Status post TAH/BSO for endometriosis with bladder suspension.  1. Postmenopausal.  No significant menopausal symptoms. 2. Rectocele/cystocele.  Was unable to fit a pessary.  Had discussed referral to Dr. Maryland Pink to consider surgical repair and the patient declined.  At this point she is not significantly symptomatic and prefers to monitor. 3. Right breast mastalgia.  Patient's pointing to the tail of Spence is tender.  Her exam is normal.  Also superior at the junction of the pectoralis and breast tissue she notes  more prominence of tissue when compared to the left.  Exam is unremarkable.  Also with family history of breast cancer.  Recommend diagnostic mammography and ultrasound and we will arrange for this.  She knows to call if she does not hear from them within a week.  We had previously and I again today discussed referral to genetic counselor to consider calculation of her lifetime risk of breast cancer and whether MRI would be indicated for screening.  The patient again declines at this time. 4. Colonoscopy 2012.  Repeat at their recommended interval. 5. Osteopenia.  DEXA 2019 T score -2.1 FRAX 14% / 0.3%.  Plan follow-up DEXA next year at 2-year interval.  We discussed calcium and vitamin D recommendations.  She is going to request a vitamin D level when she sees her primary physician in December. 6. Pap smear 2018.  No Pap smear done today.  No history of abnormal Pap smears.  We discussed stop screening guidelines based on hysterectomy history.  Will readdress on an annual basis. 7. Health maintenance.  No routine lab work done as patient does this elsewhere.  Follow-up 1 year, sooner as needed.   Anastasio Auerbach MD, 8:32 AM 08/19/2019

## 2019-08-19 NOTE — Telephone Encounter (Signed)
-----   Message from Anastasio Auerbach, MD sent at 08/19/2019  8:37 AM EDT ----- Schedule diagnostic mammography and ultrasound reference right breast tenderness tail of Spence.  Also complaining of more prominent tissue periphery of the right breast at 12:00.  Physician exam is normal.  Family history of breast cancer

## 2019-08-19 NOTE — Patient Instructions (Signed)
Mammography facility should call to schedule your mammogram and ultrasound.  If you do not hear from them within the week call my office.  Follow-up in 1 year for annual exam.

## 2019-08-20 NOTE — Telephone Encounter (Signed)
Patient informed. 

## 2019-08-21 ENCOUNTER — Other Ambulatory Visit: Payer: Self-pay

## 2019-08-21 ENCOUNTER — Ambulatory Visit
Admission: RE | Admit: 2019-08-21 | Discharge: 2019-08-21 | Disposition: A | Discharge status: Home / Self Care | Payer: 59 | Source: Ambulatory Visit | Attending: Gynecology | Admitting: Gynecology

## 2019-08-21 DIAGNOSIS — N644 Mastodynia: Secondary | ICD-10-CM

## 2019-11-07 HISTORY — PX: OTHER SURGICAL HISTORY: SHX169

## 2019-11-27 ENCOUNTER — Ambulatory Visit: Payer: 59 | Attending: Internal Medicine

## 2019-11-27 DIAGNOSIS — Z23 Encounter for immunization: Secondary | ICD-10-CM | POA: Insufficient documentation

## 2019-11-27 NOTE — Progress Notes (Signed)
   Covid-19 Vaccination Clinic  Name:  Anne Alexander    MRN: [id number] DOB: Sep 15, 1951  11/27/2019  [name] was observed post Covid-19 immunization for 15 minutes without incidence. She was provided with Vaccine Information Sheet and instruction to access the V-Safe system.   [name] was instructed to call 911 with any severe reactions post vaccine: Marland Kitchen Difficulty breathing  . Swelling of your face and throat  . A fast heartbeat  . A bad rash all over your body  . Dizziness and weakness    Immunizations Administered    Name Date Dose VIS Date Route   Pfizer COVID-19 Vaccine 11/27/2019  1:19 PM 0.3 mL 10/17/2019 Intramuscular   Manufacturer: [employer], [employer]   Lot: [id number]   NDC: [address]-6

## 2019-12-15 ENCOUNTER — Ambulatory Visit: Payer: 59 | Attending: Internal Medicine

## 2019-12-15 DIAGNOSIS — Z23 Encounter for immunization: Secondary | ICD-10-CM | POA: Insufficient documentation

## 2019-12-15 NOTE — Progress Notes (Signed)
   Covid-19 Vaccination Clinic  Name:  Anne Alexander    MRN: [id number] DOB: 31-Dec-1950  12/15/2019  [name] was observed post Covid-19 immunization for 15 minutes without incidence. She was provided with Vaccine Information Sheet and instruction to access the V-Safe system.   [name] was instructed to call 911 with any severe reactions post vaccine: Marland Kitchen Difficulty breathing  . Swelling of your face and throat  . A fast heartbeat  . A bad rash all over your body  . Dizziness and weakness    Immunizations Administered    Name Date Dose VIS Date Route   Pfizer COVID-19 Vaccine 12/15/2019 10:41 AM 0.3 mL 10/17/2019 Intramuscular   Manufacturer: [employer], [employer]   Lot: [id number]   NDC: [address]-4

## 2019-12-26 ENCOUNTER — Ambulatory Visit: Payer: 59

## 2020-02-12 ENCOUNTER — Encounter: Payer: Self-pay | Admitting: Internal Medicine

## 2020-04-20 ENCOUNTER — Encounter: Payer: Self-pay | Admitting: Gastroenterology

## 2020-06-08 ENCOUNTER — Ambulatory Visit (AMBULATORY_SURGERY_CENTER): Payer: Self-pay | Admitting: *Deleted

## 2020-06-08 ENCOUNTER — Other Ambulatory Visit: Payer: Self-pay

## 2020-06-08 ENCOUNTER — Encounter: Payer: Self-pay | Admitting: Gastroenterology

## 2020-06-08 VITALS — Ht 65.0 in | Wt 179.0 lb

## 2020-06-08 DIAGNOSIS — Z1211 Encounter for screening for malignant neoplasm of colon: Secondary | ICD-10-CM

## 2020-06-08 MED ORDER — SUTAB 1479-225-188 MG PO TABS: 24.0000 | ORAL_TABLET | ORAL | 0 refills | Status: DC

## 2020-06-08 NOTE — Progress Notes (Signed)
12-15-19 cov vacc   No egg or soy allergy known to patient  No issues with past sedation with any surgeries or procedures no intubation problems in the past  No diet pills per patient No home 02 use per patient  No blood thinners per patient  Pt states  issues with constipation- she states has a bm maybe once a week- dulcolax soft chews maybe 2 x a month - even these don't work great  -  Eats fruits and veggies a lot but has decreased fluid intake - will do a 2 day Sutab  No A fib or A flutter  EMMI video to pt or MyChart  COVID 19 guidelines implemented in [facility] today   Sutab  Coupon given to pt in [facility] today   Due to the COVID-19 pandemic we are asking patients to follow these guidelines. Please only bring one care partner. Please be aware that your care partner may wait in the car in the parking lot or if they feel like they will be too hot to wait in the car, they may wait in the lobby on the 4th floor. All care partners are required to wear a mask the entire time (we do not have any that we can provide them), they need to practice social distancing, and we will do a Covid check for all patient's and care partners when you arrive. Also we will check their temperature and your temperature. If the care partner waits in their car they need to stay in the parking lot the entire time and we will call them on their cell phone when the patient is ready for discharge so they can bring the car to the front of the building. Also all patient's will need to wear a mask into building.

## 2020-06-22 ENCOUNTER — Other Ambulatory Visit: Payer: Self-pay

## 2020-06-22 ENCOUNTER — Encounter: Payer: 59 | Admitting: Gastroenterology

## 2020-06-22 ENCOUNTER — Ambulatory Visit (AMBULATORY_SURGERY_CENTER): Payer: 59 | Admitting: Gastroenterology

## 2020-06-22 ENCOUNTER — Encounter: Payer: Self-pay | Admitting: Gastroenterology

## 2020-06-22 VITALS — BP 110/59 | HR 55 | Temp 98.1°F | Resp 13 | Ht 65.0 in | Wt 179.0 lb

## 2020-06-22 DIAGNOSIS — Z1211 Encounter for screening for malignant neoplasm of colon: Secondary | ICD-10-CM

## 2020-06-22 MED ORDER — SODIUM CHLORIDE 0.9 % IV SOLN: 500.0000 mL | INTRAVENOUS | Status: DC

## 2020-06-22 NOTE — Progress Notes (Signed)
pt tolerated well. VSS. awake and to recovery. Report given to RN.  

## 2020-06-22 NOTE — Patient Instructions (Signed)
Internal hemorrhoids and diverticulosis.   High fiber diet recommended.  Resume previous medications.  Handouts on findings given to patient.    YOU HAD AN ENDOSCOPIC PROCEDURE TODAY AT THE [facility]:   Refer to the procedure report that was given to you for any specific questions about what was found during the examination.  If the procedure report does not answer your questions, please call your gastroenterologist to clarify.  If you requested that your care partner not be given the details of your procedure findings, then the procedure report has been included in a sealed envelope for you to review at your convenience later.  YOU SHOULD EXPECT: Some feelings of bloating in the abdomen. Passage of more gas than usual.  Walking can help get rid of the air that was put into your GI tract during the procedure and reduce the bloating. If you had a lower endoscopy (such as a colonoscopy or flexible sigmoidoscopy) you may notice spotting of blood in your stool or on the toilet paper. If you underwent a bowel prep for your procedure, you may not have a normal bowel movement for a few days.  Please Note:  You might notice some irritation and congestion in your nose or some drainage.  This is from the oxygen used during your procedure.  There is no need for concern and it should clear up in a day or so.  SYMPTOMS TO REPORT IMMEDIATELY:   Following lower endoscopy (colonoscopy or flexible sigmoidoscopy):  Excessive amounts of blood in the stool  Significant tenderness or worsening of abdominal pains  Swelling of the abdomen that is new, acute  Fever of 100F or higher  For urgent or emergent issues, a gastroenterologist can be reached at any hour by calling [phone]. Do not use MyChart messaging for urgent concerns.    DIET:  We do recommend a small meal at first, but then you may proceed to your regular diet.  Drink plenty of fluids but you should avoid alcoholic beverages for 24  hours.  ACTIVITY:  You should plan to take it easy for the rest of today and you should NOT DRIVE or use heavy machinery until tomorrow (because of the sedation medicines used during the test).    FOLLOW UP: Our staff will call the number listed on your records 48-72 hours following your procedure to check on you and address any questions or concerns that you may have regarding the information given to you following your procedure. If we do not reach you, we will leave a message.  We will attempt to reach you two times.  During this call, we will ask if you have developed any symptoms of COVID 19. If you develop any symptoms (ie: fever, flu-like symptoms, shortness of breath, cough etc.) before then, please call [phone].  If you test positive for Covid 19 in the 2 weeks post procedure, please call and report this information to [place].    If any biopsies were taken you will be contacted by phone or by letter within the next 1-3 weeks.  Please call us at [phone] if you have not heard about the biopsies in 3 weeks.    SIGNATURES/CONFIDENTIALITY: You and/or your care partner have signed paperwork which will be entered into your electronic medical record.  These signatures attest to the fact that that the information above on your After Visit Summary has been reviewed and is understood.  Full responsibility of the confidentiality of this discharge information lies  with you and/or your care-partner.

## 2020-06-22 NOTE — Op Note (Signed)
[facility] Patient Name: Anne Alexander Procedure Date: 06/22/2020 8:01 AM MRN: [id number] Endoscopist: Meryl Dare , MD Age: 69 Referring MD:  Date of Birth: Jul 22, 1951 Gender: Female Account #: [id number] Procedure:                Colonoscopy Indications:              Screening for colorectal malignant neoplasm Medicines:                Monitored Anesthesia Care Procedure:                Pre-Anesthesia Assessment:                           - Prior to the procedure, a History and Physical                            was performed, and patient medications and                            allergies were reviewed. The patient's tolerance of                            previous anesthesia was also reviewed. The risks                            and benefits of the procedure and the sedation                            options and risks were discussed with the patient.                            All questions were answered, and informed consent                            was obtained. Prior Anticoagulants: The patient has                            taken no previous anticoagulant or antiplatelet                            agents. ASA Grade Assessment: II - A patient with                            mild systemic disease. After reviewing the risks                            and benefits, the patient was deemed in                            satisfactory condition to undergo the procedure.                           After obtaining informed consent, the colonoscope  was passed under direct vision. Throughout the                            procedure, the patient's blood pressure, pulse, and                            oxygen saturations were monitored continuously. The                            Colonoscope was introduced through the anus and                            advanced to the the cecum, identified by                            appendiceal orifice and  ileocecal valve. The                            ileocecal valve, appendiceal orifice, and rectum                            were photographed. The quality of the bowel                            preparation was good. The colonoscopy was performed                            without difficulty. The patient tolerated the                            procedure well. Scope In: 8:06:49 AM Scope Out: 8:21:29 AM Scope Withdrawal Time: 0 hours 11 minutes 26 seconds  Total Procedure Duration: 0 hours 14 minutes 40 seconds  Findings:                 The perianal and digital rectal examinations were                            normal.                           Scattered medium-mouthed diverticula were found in                            the right colon. There was no evidence of                            diverticular bleeding.                           Multiple medium-mouthed diverticula were found in                            the left colon. There was narrowing of the colon in  association with the diverticular opening. There                            was evidence of diverticular spasm. There was                            evidence of an impacted diverticulum.                           Internal hemorrhoids were found during                            retroflexion. The hemorrhoids were medium-sized and                            Grade I (internal hemorrhoids that do not prolapse).                           The exam was otherwise without abnormality on                            direct and retroflexion views. Complications:            No immediate complications. Estimated blood loss:                            None. Estimated Blood Loss:     Estimated blood loss: none. Impression:               - Mild diverticulosis in the right colon.                           - Moderate diverticulosis in the left colon.                           - Internal hemorrhoids.                            - The examination was otherwise normal on direct                            and retroflexion views.                           - No specimens collected. Recommendation:           - Repeat colonoscopy in 10 years for screening                            purposes.                           - Patient has a contact number available for                            emergencies. The signs and symptoms of potential  delayed complications were discussed with the                            patient. Return to normal activities tomorrow.                            Written discharge instructions were provided to the                            patient.                           - High fiber diet.                           - Continue present medications. Meryl DareMalcolm T Elfego Giammarino, MD 06/22/2020 8:24:48 AM This report has been signed electronically.

## 2020-06-22 NOTE — Progress Notes (Signed)
Vs CW I have reviewed the patient's medical history in detail and updated the computerized patient record.   

## 2020-06-24 ENCOUNTER — Telehealth: Payer: Self-pay

## 2020-06-24 NOTE — Telephone Encounter (Signed)
  Follow up Call-  Call back number 06/22/2020  Post procedure Call Back phone  # [phone]  Permission to leave phone message Yes  Some recent data might be hidden     Patient questions:  Do you have a fever, pain , or abdominal swelling? No. Pain Score  0 *  Have you tolerated food without any problems? Yes.    Have you been able to return to your normal activities? Yes.    Do you have any questions about your discharge instructions: Diet   No. Medications  No. Follow up visit  No.  Do you have questions or concerns about your Care? No.  Actions: * If pain score is 4 or above: No action needed, pain <4.  1. Have you developed a fever since your procedure? no  2.   Have you had an respiratory symptoms (SOB or cough) since your procedure? no  3.   Have you tested positive for COVID 19 since your procedure no  4.   Have you had any family members/close contacts diagnosed with the COVID 19 since your procedure?  no   If yes to any of these questions please route to Laverna Peace, RN and Karlton Lemon, RN

## 2020-07-27 DIAGNOSIS — U071 COVID-19: Secondary | ICD-10-CM

## 2020-07-27 HISTORY — DX: COVID-19: U07.1

## 2020-07-28 ENCOUNTER — Telehealth: Payer: Self-pay | Admitting: Unknown Physician Specialty

## 2020-07-28 ENCOUNTER — Other Ambulatory Visit: Payer: Self-pay | Admitting: Unknown Physician Specialty

## 2020-07-28 DIAGNOSIS — U071 COVID-19: Secondary | ICD-10-CM

## 2020-07-28 DIAGNOSIS — E663 Overweight: Secondary | ICD-10-CM

## 2020-07-28 NOTE — Telephone Encounter (Signed)
I connected by phone with Anne Alexander on 07/28/2020 at 3:00 PM to discuss the potential use of a new treatment for mild to moderate COVID-19 viral infection in non-hospitalized patients.  This patient is a 69 y.o. female that meets the FDA criteria for Emergency Use Authorization of COVID monoclonal antibody casirivimab/imdevimab.  Has a (+) direct SARS-CoV-2 viral test result  Has mild or moderate COVID-19   Is NOT hospitalized due to COVID-19  Is within 10 days of symptom onset  Has at least one of the high risk factor(s) for progression to severe COVID-19 and/or hospitalization as defined in EUA.  Specific high risk criteria : Older age (>/= 67 yo) and BMI > 25   I have spoken and communicated the following to the patient or parent/caregiver regarding COVID monoclonal antibody treatment:  1. FDA has authorized the emergency use for the treatment of mild to moderate COVID-19 in adults and pediatric patients with positive results of direct SARS-CoV-2 viral testing who are 27 years of age and older weighing at least 40 kg, and who are at high risk for progressing to severe COVID-19 and/or hospitalization.  2. The significant known and potential risks and benefits of COVID monoclonal antibody, and the extent to which such potential risks and benefits are unknown.  3. Information on available alternative treatments and the risks and benefits of those alternatives, including clinical trials.  4. Patients treated with COVID monoclonal antibody should continue to self-isolate and use infection control measures (e.g., wear mask, isolate, social distance, avoid sharing personal items, clean and disinfect "high touch" surfaces, and frequent handwashing) according to CDC guidelines.   5. The patient or parent/caregiver has the option to accept or refuse COVID monoclonal antibody treatment.  After reviewing this information with the patient, The patient agreed to proceed with receiving  casirivimab\imdevimab infusion and will be provided a copy of the Fact sheet prior to receiving the infusion. [name] 07/28/2020 3:00 PM  Sx onset 07/25/20

## 2020-07-29 ENCOUNTER — Ambulatory Visit (HOSPITAL_COMMUNITY)
Admission: RE | Admit: 2020-07-29 | Discharge: 2020-07-29 | Disposition: A | Discharge status: Home / Self Care | Payer: 59 | Source: Ambulatory Visit | Attending: Pulmonary Disease | Admitting: Pulmonary Disease

## 2020-07-29 ENCOUNTER — Other Ambulatory Visit (HOSPITAL_COMMUNITY): Payer: Self-pay

## 2020-07-29 DIAGNOSIS — E663 Overweight: Secondary | ICD-10-CM | POA: Insufficient documentation

## 2020-07-29 DIAGNOSIS — U071 COVID-19: Secondary | ICD-10-CM | POA: Insufficient documentation

## 2020-07-29 MED ORDER — FAMOTIDINE IN NACL 20-0.9 MG/50ML-% IV SOLN: 20.0000 mg | Freq: Once | INTRAVENOUS | Status: DC | PRN

## 2020-07-29 MED ORDER — SODIUM CHLORIDE 0.9 % IV SOLN: INTRAVENOUS | Status: DC | PRN

## 2020-07-29 MED ORDER — METHYLPREDNISOLONE SODIUM SUCC 125 MG IJ SOLR: 125.0000 mg | Freq: Once | INTRAMUSCULAR | Status: DC | PRN

## 2020-07-29 MED ORDER — SODIUM CHLORIDE 0.9 % IV SOLN
1200.0000 mg | Freq: Once | INTRAVENOUS | Status: AC
  Administered 2020-07-29: 1200 mg via INTRAVENOUS

## 2020-07-29 MED ORDER — ALBUTEROL SULFATE HFA 108 (90 BASE) MCG/ACT IN AERS: 2.0000 | INHALATION_SPRAY | Freq: Once | RESPIRATORY_TRACT | Status: DC | PRN

## 2020-07-29 MED ORDER — DIPHENHYDRAMINE HCL 50 MG/ML IJ SOLN: 50.0000 mg | Freq: Once | INTRAMUSCULAR | Status: DC | PRN

## 2020-07-29 MED ORDER — EPINEPHRINE 0.3 MG/0.3ML IJ SOAJ: 0.3000 mg | Freq: Once | INTRAMUSCULAR | Status: DC | PRN

## 2020-07-29 NOTE — Progress Notes (Signed)
  Diagnosis: COVID-19  Physician:Dr Wright   Procedure: [facility] Infusion Clinic Med: casirivimab\imdevimab infusion - Provided patient with casirivimab\imdevimab fact sheet for patients, parents and caregivers prior to infusion.  Complications: No immediate complications noted.  Discharge: Discharged home   Chrishana Spargur W 07/29/2020  

## 2020-07-29 NOTE — Discharge Instructions (Signed)

## 2020-08-19 ENCOUNTER — Encounter: Payer: Self-pay | Admitting: Obstetrics and Gynecology

## 2020-08-19 ENCOUNTER — Ambulatory Visit (INDEPENDENT_AMBULATORY_CARE_PROVIDER_SITE_OTHER): Payer: 59 | Admitting: Obstetrics and Gynecology

## 2020-08-19 ENCOUNTER — Other Ambulatory Visit: Payer: Self-pay

## 2020-08-19 VITALS — BP 118/78 | Ht 65.0 in | Wt 169.0 lb

## 2020-08-19 DIAGNOSIS — Z01419 Encounter for gynecological examination (general) (routine) without abnormal findings: Secondary | ICD-10-CM | POA: Diagnosis not present

## 2020-08-19 DIAGNOSIS — N8189 Other female genital prolapse: Secondary | ICD-10-CM | POA: Diagnosis not present

## 2020-08-19 DIAGNOSIS — Z23 Encounter for immunization: Secondary | ICD-10-CM | POA: Diagnosis not present

## 2020-08-19 DIAGNOSIS — M8580 Other specified disorders of bone density and structure, unspecified site: Secondary | ICD-10-CM

## 2020-08-19 NOTE — Addendum Note (Signed)
Addended by: Dayna Barker on: 08/19/2020 08:56 AM   Modules accepted: Orders

## 2020-08-19 NOTE — Progress Notes (Signed)
Anne Alexander 21-Mar-1951 [id number]  SUBJECTIVE:  69 y.o. G1P0001 female here for a breast and pelvic exam. She has no gynecologic concerns.  Current Outpatient Medications  Medication Sig Dispense Refill  . cyclobenzaprine (FLEXERIL) 10 MG tablet Take 10 mg by mouth every 8 (eight) hours as needed. (Patient not taking: Reported on 06/22/2020)    . diclofenac (VOLTAREN) 50 MG EC tablet Take 50 mg by mouth 2 (two) times daily.  3  . diphenhydrAMINE HCl (BENADRYL PO) Take by mouth.    . Doxepin HCl (SILENOR) 6 MG TABS Take by mouth. (Patient not taking: Reported on 06/22/2020)    . doxycycline (VIBRA-TABS) 100 MG tablet Take 100 mg by mouth daily.    Marland Kitchen levothyroxine (SYNTHROID, LEVOTHROID) 75 MCG tablet Take 75 mcg by mouth daily before breakfast.    . Magnesium Hydroxide (DULCOLAX SOFT CHEWS) 1200 MG CHEW Chew by mouth. Uses about 2 x a month    . OVER THE COUNTER MEDICATION ZZZ Quil at bedtime PRN    . Sodium Sulfate-Mag Sulfate-KCl (SUTAB) [phone] MG TABS Take 24 tablets by mouth as directed. BIN: [id number] PCN: CN GROUP: [id number] MEMBER ID: [id number];SWF AS CASH;NO PRIOR AUTHORIZATION 24 tablet 0   Current Facility-Administered Medications  Medication Dose Route Frequency Provider Last Rate Last Admin  . 0.9 %  sodium chloride infusion  500 mL Intravenous Continuous Meryl Dare, MD       Allergies: Penicillins  No LMP recorded. Patient has had a hysterectomy.  Past medical history,surgical history, problem list, medications, allergies, family history and social history were all reviewed and documented as reviewed in the EPIC chart.  GYN ROS: no abnormal bleeding, pelvic pain or discharge, no breast pain or new or enlarging lumps on self exam.  No dysuria, frequency, burning, pain with urination, cloudy/malodorous urine.   OBJECTIVE:  Ht 5\' 5"  (1.651 m)   Wt 169 lb (76.7 kg)   BMI 28.12 kg/m  The patient appears well, alert, oriented, in no distress.  BREAST EXAM:  breasts appear normal, no suspicious masses, no skin or nipple changes or axillary nodes  PELVIC EXAM: VULVA: normal appearing vulva with atrophic change, no masses, tenderness or lesions, VAGINA: normal appearing vagina with atrophic change, grade 2 cystocele, grade 2-3 rectocele normal color and discharge, no lesions, CERVIX: surgically absent, UTERUS: surgically absent, vaginal cuff normal and well supported, ADNEXA: no masses, nontender  Chaperone: present during the examination  ASSESSMENT:  69 y.o. G1P0001 here for a breast and pelvic exam  PLAN:   1. Postmenopausal. Prior TAH BSO for endometriosis and previous bladder suspension.  No significant menopausal symptoms.  No vaginal bleeding. 2. Pelvic organ prolapse.  Pessary not able to be fitted.  Declined urogynecology referral previously.  She is having some predominantly stool trapping symptoms with the rectocele.  We again discussed possibly of urogynecology referral and she would like to think about it and will let 79 know. 3. Pap smear 2018.  No history of abnormal Pap smears.  We discussed the current screening intervals and based on age and prior hysterectomy she is comfortable with not continuing screening. 4. Mammogram 08/2019.  Normal breast exam today.  To schedule an annual mammogram this year. 5. Colonoscopy 2021.  She will follow up at the interval recommended by her GI specialist.   6. Osteopenia.  DEXA 2019.  T score -2.1.  She is not taking supplements so we discussed vitamin D and calcium.  Recommend that she requests a  vitamin D level with her primary doctor when she gets her blood work there.  Next DEXA recommended this year so she plans to schedule this. 7. Health maintenance.  No labs today as she normally has these completed elsewhere.  Return annually or sooner, prn.  Theresia Majors MD 08/19/20

## 2020-09-17 ENCOUNTER — Other Ambulatory Visit: Payer: Self-pay | Admitting: Obstetrics and Gynecology

## 2020-09-17 DIAGNOSIS — Z1231 Encounter for screening mammogram for malignant neoplasm of breast: Secondary | ICD-10-CM

## 2020-10-18 ENCOUNTER — Telehealth: Payer: Self-pay

## 2020-10-18 NOTE — Telephone Encounter (Signed)
Patient is ready for referral to Urogynecologist for surgery. She said the prolapse situation seems to be worsening and she is ready to proceed.  She wanted to know who you would recommend.  She is coming for BD test tomorrow at 2:00pm and wants [place] to provide the info then instead of me calling her back.

## 2020-10-19 ENCOUNTER — Ambulatory Visit (INDEPENDENT_AMBULATORY_CARE_PROVIDER_SITE_OTHER): Payer: 59

## 2020-10-19 ENCOUNTER — Other Ambulatory Visit: Payer: Self-pay | Admitting: Obstetrics and Gynecology

## 2020-10-19 ENCOUNTER — Other Ambulatory Visit: Payer: Self-pay

## 2020-10-19 DIAGNOSIS — M8588 Other specified disorders of bone density and structure, other site: Secondary | ICD-10-CM

## 2020-10-19 DIAGNOSIS — Z780 Asymptomatic menopausal state: Secondary | ICD-10-CM | POA: Diagnosis not present

## 2020-10-19 DIAGNOSIS — M8580 Other specified disorders of bone density and structure, unspecified site: Secondary | ICD-10-CM

## 2020-10-19 DIAGNOSIS — Z01419 Encounter for gynecological examination (general) (routine) without abnormal findings: Secondary | ICD-10-CM

## 2020-10-19 NOTE — Telephone Encounter (Signed)
Yes she can be referred to urogynecology as we discussed, thank you

## 2020-10-19 NOTE — Telephone Encounter (Signed)
Forwarded to [place] to assist with referral.

## 2020-10-20 ENCOUNTER — Telehealth: Payer: Self-pay | Admitting: *Deleted

## 2020-10-20 DIAGNOSIS — N8189 Other female genital prolapse: Secondary | ICD-10-CM

## 2020-10-20 NOTE — Telephone Encounter (Signed)
-----   Message from Keenan Bachelor, [place] sent at 10/19/2020  3:04 PM EST ----- Regarding: referral to urogyn Per Dr. Melvenia Needles  "Yes she can be referred to urogynecology as we discussed, thank you."

## 2020-10-20 NOTE — Telephone Encounter (Signed)
Per note on 08/19/20 "Pelvic organ prolapse.  Pessary not able to be fitted.  Declined urogynecology referral previously.  She is having some predominantly stool trapping symptoms with the rectocele.  We again discussed possibly of urogynecology referral and she would like to think about it and will let us know.  Referral placed in epic at [facility] they will call to schedule.

## 2020-10-21 NOTE — Telephone Encounter (Signed)
Patient scheduled on 11/01/20 Anne Alexander

## 2020-10-26 NOTE — Progress Notes (Signed)
[facility] New Patient Evaluation and Consultation  Referring Provider: Theresia Majors, MD PCP: Gaspar Garbe, MD Date of Service: 11/01/2020  SUBJECTIVE Chief Complaint: New Patient (Initial Visit) (Dr Penni Bombard referral for prolapse)  History of Present Illness: Anne Alexander is a 69 y.o. White or Caucasian female seen in consultation at the request of Dr. Penni Bombard for evaluation of prolapse.    Review of records from Dr Penni Bombard significant for: Has prolapse and not able to be fitted with pessary. She has symptoms of stool trapping.  She has had a TAH, BSO and Raz bladder suspension in the past.   Urinary Symptoms: Leaks urine with cough/ sneeze, with a full bladder and while asleep. Urgency is mostly at night. Usually tries to empty her bladder more often so she does not have urgency Leaks- unsure because she uses pads Pad use: 2 pads per day.   She is bothered by her UI symptoms. Has tried two pessaries last year and could not be fit.   Day time voids 10.  Nocturia: 2 times per night to void. Voiding dysfunction: she does not empty her bladder well.  does not use a catheter to empty bladder.  When urinating, she feels a weak stream, difficulty starting urine stream and to push on her belly or vagina to empty bladder Drinks: 2-3 cups coffee, 1 caffeine free diet coke, 2-16oz bottles water  (One bottle with a sweetener) per day  UTIs: 0 UTI's in the last year.   Denies history of blood in urine and kidney or bladder stones  Pelvic Organ Prolapse Symptoms:                  She Admits to a feeling of a bulge the vaginal area. It has been present for a few years She Denies seeing a bulge.  This bulge is bothersome- really affecting her quality of life.  Feels that rectum feels full.   Bowel Symptom: Bowel movements: every other day when taking Miralax  Stool consistency: soft  with Miralax Straining: yes.  Splinting: yes.  Incomplete evacuation: yes.   She Admits to accidental bowel leakage / fecal incontinence  Occurs: 1-2 time(s) per month  Consistency with leakage: soft - usually if she has too much miralx Bowel regimen: miralax Last colonoscopy: Date 06/2020, Results negative  Sexual Function Sexually active: no.    Pelvic Pain Denies pelvic pain  Past Medical History:  Past Medical History:  Diagnosis Date  . Allergy   . Anxiety   . Cataract    forming very small   . COVID   . GERD (gastroesophageal reflux disease)   . High cholesterol   . Memory change   . Neuromuscular disorder (HCC)    hiatal hernia   . Osteopenia 06/2018   T score -2.1 FRAX 14% / 0.3%  . Sleep deficient   . Thyroid condition      Past Surgical History:   Past Surgical History:  Procedure Laterality Date  . BLADDER SUSPENSION     Raz bladder suspension  . NECK SURGERY    . OOPHORECTOMY Bilateral age 89   endometriosis  . TONSILLECTOMY    . VAGINAL HYSTERECTOMY  age 70     Past OB/GYN History: G1 P1 Vaginal deliveries: 1,  Forceps/ Vacuum deliveries: 0, Cesarean section: 0 Menopausal: Yes, Denies vaginal bleeding since menopause Contraception: n/a. Last pap smear- none, s/p hysterectomy   Medications: She has a current medication list which includes the following prescription(s):  cyclobenzaprine, diclofenac, diphenhydramine hcl, doxepin hcl, doxycycline, levothyroxine, OVER THE COUNTER MEDICATION, and sutab, and the following Facility-Administered Medications: sodium chloride.   Allergies: Patient is allergic to penicillins.   Social History:  Social History   Tobacco Use  . Smoking status: Former Smoker    Packs/day: 1.00    Years: 20.00    Pack years: 20.00    Types: Cigarettes    Quit date: 03/24/2006    Years since quitting: 14.6  . Smokeless tobacco: Never Used  Vaping Use  . Vaping Use: Never used  Substance Use Topics  . Alcohol use: No  . Drug use: No    Relationship status: married She lives with husband.    She is employed at [employer] [place].   Family History:   Family History  Problem Relation Age of Onset  . Dementia Mother   . Breast cancer Mother 62  . Alzheimer's disease Maternal Aunt   . Alzheimer's disease Maternal Grandfather   . Dementia Brother   . Heart Problems Sister      Review of Systems: Review of Systems  Constitutional: Negative for fever, malaise/fatigue and weight loss.  Respiratory: Negative for cough, shortness of breath and wheezing.   Cardiovascular: Negative for chest pain, palpitations and leg swelling.  Gastrointestinal: Negative for abdominal pain and blood in stool.  Genitourinary: Negative for dysuria.  Musculoskeletal: Negative for myalgias.  Skin: Negative for rash.  Neurological: Negative for dizziness and headaches.  Endo/Heme/Allergies: Does not bruise/bleed easily.       + hot flashes  Psychiatric/Behavioral: Negative for depression. The patient is not nervous/anxious.      OBJECTIVE Physical Exam: Vitals:   11/01/20 1435  BP: (!) 174/72  Pulse: 60  Weight: 171 lb 8 oz (77.8 kg)  Height: 5\' 5"  (1.651 m)    Physical Exam Constitutional:      General: She is not in acute distress. Pulmonary:     Effort: Pulmonary effort is normal.  Abdominal:     General: There is no distension.     Palpations: Abdomen is soft.     Tenderness: There is no abdominal tenderness. There is no rebound.  Musculoskeletal:        General: No swelling. Normal range of motion.  Skin:    General: Skin is warm and dry.     Findings: No rash.  Neurological:     Mental Status: She is alert and oriented to person, place, and time.  Psychiatric:        Mood and Affect: Mood normal.        Behavior: Behavior normal.    GU / Detailed Urogynecologic Evaluation:  Pelvic Exam: Normal external female genitalia; Bartholin's and Skene's glands normal in appearance; urethral meatus normal in appearance, no urethral masses or discharge.   CST: negative  s/p  hysterectomy: Speculum exam reveals normal vaginal mucosa with  atrophy and normal vaginal cuff.  Adnexa no mass, fullness, tenderness.    With apex supported, anterior and posterior compartment defect was reduced  Pelvic floor strength I/V, puborectalis I/V external anal sphincter I/V  Pelvic floor musculature: Right levator non-tender, Right obturator non-tender, Left levator non-tender, Left obturator non-tender  POP-Q:   POP-Q  -1                                            Aa   -1  Ba  -7.5                                              C   5                                            Gh  4.5                                            Pb  8.5                                            tvl   1                                            Ap  1                                            Bp                                                 D     Rectal Exam:  Normal sphincter tone, moderate distal rectocele, enterocoele not present, no rectal masses, no sign of dyssynergia when asking the patient to bear down.  Post-Void Residual (PVR) by Bladder Scan: In order to evaluate bladder emptying, we discussed obtaining a postvoid residual and she agreed to this procedure.  Procedure: The ultrasound unit was placed on the patient's abdomen in the suprapubic region after the patient had voided. A PVR of 12 ml was obtained by bladder scan.  Laboratory Results: POC urine: negative  I visualized the urine specimen, noting the specimen to be clear yellow  ASSESSMENT AND PLAN Ms. Dahlia ClientBrowning is a 69 y.o. with:  1. Prolapse of posterior vaginal wall   2. Prolapse of anterior vaginal wall   3. Vaginal vault prolapse after hysterectomy   4. Urinary frequency   5. Overactive bladder   6. SUI (stress urinary incontinence, female)   7. Incontinence of feces, unspecified fecal incontinence type     1. Stage II anterior, Stage II posterior, Stage  I apical prolapse For treatment of pelvic organ prolapse, we discussed options for management including expectant management, conservative management, and surgical management, such as Kegels, a pessary, pelvic floor physical therapy, and specific surgical procedures. - She is interested in proceeding with surgery. Discussed options:  We discussed two options for prolapse repair:  1) vaginal repair without mesh - Pros - safer, no mesh complications - Cons - not as strong as mesh repair, higher risk of recurrence  2) laparoscopic repair with mesh - Pros - stronger, better long-term  success - Cons - risks of mesh implant (erosion into vagina or bladder, adhering to the rectum, pain) - these risks are lower than with a vaginal mesh but still exist  - She would like to proceed with vaginal repair: Sacrospinous ligament fixation, posterior repair, possible anterior repair.  - She will need urodynamic testing prior to scheduling.   2. Urinary frequency - no sign of UTI on POC urine  3. OAB We discussed the symptoms of overactive bladder (OAB), which include urinary urgency, urinary frequency, nocturia, with or without urge incontinence.  While we do not know the exact etiology of OAB, several treatment options exist. We discussed management including behavioral therapy (decreasing bladder irritants, urge suppression strategies, timed voids, bladder retraining), physical therapy, medication; for refractory cases posterior tibial nerve stimulation, sacral neuromodulation, and intravesical botulinum toxin injection.  - She will work on reducing bladder irritants and list provided.   4. SUI For treatment of stress urinary incontinence,  non-surgical options include expectant management, weight loss, physical therapy, as well as a pessary.  Surgical options include a midurethral sling, Burch urethropexy, and transurethral injection of a bulking agent. - She is interested in concomitant repair with sling.  She will undergo urodynamic testing to demonstrate incontinence.   5. Accidental Bowel Leakage:  - Treatment options include anti-diarrhea medication (loperamide/ Imodium OTC or prescription lomotil), fiber supplements, physical therapy, and possible sacral neuromodulation or surgery.   - She will work on adding fiber supplementation to help bulk stools and take with Miralax.  - Discussed that surgery for her rectocele may not improve symptoms of bowel/ gas leakage and she can consider pelvic floor physical therapy for muscle strengthening post procedure.   Return for urodynamic testing   Marguerita Beards, MD   Medical Decision Making:  - Reviewed/ ordered a clinical laboratory test - Reviewed/ ordered medicine test - Review and summation of prior records

## 2020-10-27 ENCOUNTER — Ambulatory Visit
Admission: RE | Admit: 2020-10-27 | Discharge: 2020-10-27 | Disposition: A | Discharge status: Home / Self Care | Payer: 59 | Source: Ambulatory Visit | Attending: Obstetrics and Gynecology | Admitting: Obstetrics and Gynecology

## 2020-10-27 ENCOUNTER — Other Ambulatory Visit: Payer: Self-pay

## 2020-10-27 DIAGNOSIS — Z1231 Encounter for screening mammogram for malignant neoplasm of breast: Secondary | ICD-10-CM

## 2020-11-01 ENCOUNTER — Encounter: Payer: Self-pay | Admitting: Obstetrics and Gynecology

## 2020-11-01 ENCOUNTER — Other Ambulatory Visit: Payer: Self-pay

## 2020-11-01 ENCOUNTER — Ambulatory Visit (INDEPENDENT_AMBULATORY_CARE_PROVIDER_SITE_OTHER): Payer: 59 | Admitting: Obstetrics and Gynecology

## 2020-11-01 VITALS — BP 174/72 | HR 60 | Ht 65.0 in | Wt 171.5 lb

## 2020-11-01 DIAGNOSIS — N3281 Overactive bladder: Secondary | ICD-10-CM

## 2020-11-01 DIAGNOSIS — R350 Frequency of micturition: Secondary | ICD-10-CM | POA: Diagnosis not present

## 2020-11-01 DIAGNOSIS — R159 Full incontinence of feces: Secondary | ICD-10-CM

## 2020-11-01 DIAGNOSIS — N993 Prolapse of vaginal vault after hysterectomy: Secondary | ICD-10-CM

## 2020-11-01 DIAGNOSIS — N8110 Cystocele, unspecified: Secondary | ICD-10-CM | POA: Diagnosis not present

## 2020-11-01 DIAGNOSIS — N816 Rectocele: Secondary | ICD-10-CM | POA: Diagnosis not present

## 2020-11-01 DIAGNOSIS — N393 Stress incontinence (female) (male): Secondary | ICD-10-CM

## 2020-11-01 LAB — POCT URINALYSIS DIPSTICK
Appearance: NORMAL
Bilirubin, UA: NEGATIVE
Blood, UA: NEGATIVE
Glucose, UA: NEGATIVE
Ketones, UA: NEGATIVE
Leukocytes, UA: NEGATIVE
Nitrite, UA: NEGATIVE
Protein, UA: NEGATIVE
Spec Grav, UA: 1.015 (ref 1.010–1.025)
Urobilinogen, UA: 0.2 E.U./dL
pH, UA: 6 (ref 5.0–8.0)

## 2020-11-01 NOTE — Patient Instructions (Addendum)
Today we talked about ways to manage bladder urgency such as altering your diet to avoid irritative beverages and foods (bladder diet) as well as attempting to decrease stress and other exacerbating factors.    The Most Bothersome Foods* The Least Bothersome Foods*  Coffee - Regular & Decaf Tea - caffeinated Carbonated beverages - cola, non-colas, diet & caffeine-free Alcohols - Beer, Red Wine, White Wine, [address] - Grapefruit, Genoa, Orange, [employer] - Cranberry, Grapefruit, Orange, Pineapple Vegetables - Tomato & Tomato Products Flavor Enhancers - Hot peppers, Spicy foods, Chili, Horseradish, Vinegar, Monosodium glutamate (MSG) Artificial Sweeteners - NutraSweet, Sweet 'N Low, Equal (sweetener), Saccharin Ethnic foods - [place], [place], [place] food [employer] - low-fat & whole Fruits - Bananas, Blueberries, Honeydew melon, Pears, Raisins, Watermelon Vegetables - Broccoli, [address] Sprouts, [place], Carrots, Cauliflower, [place], Cucumber, Mushrooms, Peas, Radishes, Squash, Zucchini, White potatoes, Sweet potatoes & yams Poultry - Chicken, Eggs, [place], [employer] - Beef, Diplomatic Services operational officer, Lamb Seafood - Shrimp, [employer] fish, Salmon Grains - Oat, Rice Snacks - Pretzels, Popcorn  *Lenward Chancellor et al. Diet and its role in interstitial cystitis/bladder pain syndrome (IC/BPS) and comorbid conditions. [employer]. BJU Int. 2012 Jan 11.    Accidental Bowel Leakage: Our goal is to achieve formed bowel movements daily or every-other-day without leakage.  You may need to try different combinations of the following options to find what works best for you.  Some management options include: Marland Kitchen Dietary changes (more leafy greens, vegetables and fruits; less processed foods) . Fiber supplementation (Metamucil or something with psyllium as active ingredient) .    You have a stage 2 (out of 4) prolapse.  We discussed the fact that it is not life threatening but there are several treatment  options. For treatment of pelvic organ prolapse, we discussed options for management including expectant management, conservative management, and surgical management, such as Kegels, a pessary, pelvic floor physical therapy, and specific surgical procedures.

## 2020-11-16 NOTE — Progress Notes (Signed)
[facility] Urodynamics Procedure  Referring Physician: Gaspar Garbe, MD Date of Procedure: 11/19/2020  Anne Alexander is a 70 y.o. female who presents for urodynamic evaluation. Indication(s) for study: mixed incontinence  Vital Signs: There were no vitals taken for this visit.  Laboratory Results: A catheterized urine specimen revealed:  POC urine: negative  Voiding Diary: She did not complete a voiding diary.   Procedure Timeout:  The correct patient was verified and the correct procedure was verified. The patient was verbally consented. The patient was in the correct position and safety precautions were reviewed based on at the patient's history.   Urodynamic Procedure A 78F dual lumen urodynamics catheter was placed under sterile conditions into the patient's bladder. A 78F catheter was placed into the rectum in order to measure abdominal pressure. EMG patches were placed in the appropriate position.  All connections were confirmed and calibrations/adjusted made. Saline was instilled into the bladder through the dual lumen catheters.  Cough/valsalva pressures were measured periodically during filling.  Patient was allowed to void.  The bladder was then emptied of its residual.  UROFLOW: Revealed a Qmax of 16.2 mL/sec.  She voided 352.3 mL and had a residual of 95 mL.  It was a normal pattern and represented normal habits.  CMG: This was performed with sterile water in the sitting position at a fill rate of 30 mL/min.    First sensation of fullness was 55 mLs,  First urge was 77 mLs,  Strong urge was 101 mLs and  Capacity was 237 mLs  Stress incontinence was/ was not demonstrated Highest negative Barrier CLPP was 154 cmH20 at 150 ml. Lowest positive Barrier VLPP was 169 cmH20 at 140 ml.  Detrusor function was normal, with no phasic contractions seen.   Compliance:  normal.  Calculated compliance was 80mL/cmH20  UPP: MUCP with barrier reduction was  70 cm H20.    MICTURITION STUDY: Voiding was performed with reduction using scopettes in the sitting position.  Pdet at Qmax was 68 cm of water.  Qmax was 22.8 mL/sec.  It was a interrupted pattern.  She voided 207 mL and had a residual of 30 mL.  It was a volitional void, sustained detrusor contraction was present and abdominal straining was present. Urethral catheter and vaginal scopettes fell out near the end of her void.   EMG: This was performed with patches.  She had voluntary contractions, recruitment with fill was present and urethral sphincter did and did not relax with void.  The details of the procedure with the study tracings have been scanned into EPIC.   Urodynamic Impression:  1. Sensation was increased; capacity was reduced 2. Stress Incontinence was demonstrated at normal pressures; 3. Detrusor Overactivity was not demonstrated. 4. Emptying was adequate although she strained,  with a normal PVR, a sustained detrusor contraction present,  abdominal straining present, normal urethral sphincter activity on EMG.  Plan: - The patient will follow up  to discuss the findings and treatment options.   Marguerita Beards, MD

## 2020-11-19 ENCOUNTER — Ambulatory Visit (INDEPENDENT_AMBULATORY_CARE_PROVIDER_SITE_OTHER): Payer: 59 | Admitting: Obstetrics and Gynecology

## 2020-11-19 ENCOUNTER — Other Ambulatory Visit: Payer: Self-pay

## 2020-11-19 ENCOUNTER — Encounter: Payer: Self-pay | Admitting: Obstetrics and Gynecology

## 2020-11-19 VITALS — BP 137/81 | HR 68 | Ht 65.0 in

## 2020-11-19 DIAGNOSIS — R350 Frequency of micturition: Secondary | ICD-10-CM | POA: Diagnosis not present

## 2020-11-19 DIAGNOSIS — N393 Stress incontinence (female) (male): Secondary | ICD-10-CM | POA: Diagnosis not present

## 2020-11-19 DIAGNOSIS — R3915 Urgency of urination: Secondary | ICD-10-CM | POA: Diagnosis not present

## 2020-11-19 LAB — POCT URINALYSIS DIPSTICK
Appearance: NORMAL
Bilirubin, UA: NEGATIVE
Blood, UA: NEGATIVE
Glucose, UA: NEGATIVE
Ketones, UA: NEGATIVE
Leukocytes, UA: NEGATIVE
Nitrite, UA: NEGATIVE
Protein, UA: NEGATIVE
Urobilinogen, UA: 0.2 E.U./dL

## 2020-11-19 NOTE — Patient Instructions (Signed)

## 2020-12-10 ENCOUNTER — Encounter: Payer: Self-pay | Admitting: Obstetrics and Gynecology

## 2020-12-10 ENCOUNTER — Ambulatory Visit (INDEPENDENT_AMBULATORY_CARE_PROVIDER_SITE_OTHER): Payer: 59 | Admitting: Obstetrics and Gynecology

## 2020-12-10 ENCOUNTER — Other Ambulatory Visit: Payer: Self-pay

## 2020-12-10 VITALS — BP 157/79 | HR 62 | Ht 64.0 in | Wt 171.0 lb

## 2020-12-10 DIAGNOSIS — N993 Prolapse of vaginal vault after hysterectomy: Secondary | ICD-10-CM | POA: Diagnosis not present

## 2020-12-10 DIAGNOSIS — N816 Rectocele: Secondary | ICD-10-CM | POA: Diagnosis not present

## 2020-12-10 DIAGNOSIS — N393 Stress incontinence (female) (male): Secondary | ICD-10-CM

## 2020-12-10 DIAGNOSIS — N8110 Cystocele, unspecified: Secondary | ICD-10-CM

## 2020-12-10 NOTE — Progress Notes (Signed)
[facility] Return Visit  SUBJECTIVE  History of Present Illness: Anne Alexander is a 70 y.o. female seen in follow-up for discussion after urodynamic testing.   She was previously interested in: Sacrospinous ligament fixation, posterior repair, possible anterior repair.   Urodynamics showed:  1. Sensation was increased; capacity was reduced 2. Stress Incontinence was demonstrated at normal pressures; 3. Detrusor Overactivity was not demonstrated. 4. Emptying was adequate although she strained,  with a normal PVR, a sustained detrusor contraction present,  abdominal straining present, normal urethral sphincter activity on EMG.   Past Medical History: Patient  has a past medical history of Allergy, Anxiety, Cataract, COVID, GERD (gastroesophageal reflux disease), High cholesterol, Memory change, Neuromuscular disorder (HCC), Osteopenia (06/2018), Sleep deficient, and Thyroid condition.   Past Surgical History: She  has a past surgical history that includes Neck surgery; Tonsillectomy; Oophorectomy (Bilateral, age 70); Bladder suspension; and Vaginal hysterectomy (age 70).   Medications: She has a current medication list which includes the following prescription(s): cyclobenzaprine, diclofenac, diphenhydramine hcl, doxepin hcl, doxycycline, levothyroxine, OVER THE COUNTER MEDICATION, and sutab, and the following Facility-Administered Medications: sodium chloride.   Allergies: Patient is allergic to penicillins.   Social History: Patient  reports that she quit smoking about 14 years ago. Her smoking use included cigarettes. She has a 20.00 pack-year smoking history. She has never used smokeless tobacco. She reports that she does not drink alcohol and does not use drugs.      OBJECTIVE     Physical Exam: Vitals:   12/10/20 1442  BP: (!) 157/79  Pulse: 62  Weight: 171 lb (77.6 kg)  Height: 5\' 4"  (1.626 m)   Gen: No apparent distress, A&O x 3.  Detailed  Urogynecologic Evaluation:  Deferred. Prior exam showed:  POP-Q (11/01/20):   POP-Q  -1                                            Aa   -1                                           Ba  -7.5                                              C   5                                            Gh  4.5                                            Pb  8.5                                            tvl   1  Ap  1                                            Bp                                                 D       ASSESSMENT AND PLAN    Anne Alexander is a 70 y.o. with:  1. Prolapse of posterior vaginal wall   2. Prolapse of anterior vaginal wall   3. Vaginal vault prolapse after hysterectomy   4. SUI (stress urinary incontinence, female)      Plan for surgery: Exam under anesthesia, sacrospinous ligament fixation, posterior repair, midurethral sling, cystoscopy,  possible anterior repair.    - We reviewed the patient's specific anatomic and functional findings, with the assistance of diagrams, and together finalized the above procedure. The planned surgical procedures were discussed along with the surgical risks outlined below, which were also provided on a detailed handout. Additional treatment options including expectant management, conservative management, medical management were discussed where appropriate.  We reviewed the benefits and risks of each treatment option. - Reviewed that although this surgery will fix her vaginal bulge, she may need physical therapy to help with muscle coordination and strengthening for her chronic constipation and anal incontinence.    General Surgical Risks: For all procedures, there are risks of bleeding, infection, damage to surrounding organs including but not limited to bowel, bladder, blood vessels, ureters and nerves, and need for further surgery if an injury were to occur. These risks are all  low with minimally invasive surgery.   There are risks of numbness and weakness at any body site or buttock/rectal pain.  It is possible that baseline pain can be worsened by surgery, either with or without mesh. If surgery is vaginal, there is also a low risk of possible conversion to laparoscopy or open abdominal incision where indicated. Very rare risks include blood transfusion, blood clot, heart attack, pneumonia, or death.   There is also a risk of short-term postoperative urinary retention with need to use a catheter. About half of patients need to go home from surgery with a catheter, which is then later removed in the office. The risk of long-term need for a catheter is very low. There is also a risk of worsening of overactive bladder.   Sling: The effectiveness of a midurethral vaginal mesh sling is approximately 85%, and thus, there will be times when you may leak urine after surgery, especially if your bladder is full or if you have a strong cough. There is a balance between making the sling tight enough to treat your leakage but not too tight so that you have long-term difficulty emptying your bladder. A mesh sling will not directly treat overactive bladder/urge incontinence and may worsen it.  There is an FDA safety notification on vaginal mesh procedures for prolapse but NOT mesh slings. We have extensive experience and training with mesh placement and we have close postoperative follow up to identify any potential complications from mesh. It is important to realize that this mesh is a permanent implant that cannot be easily removed. There are rare risks of mesh exposure (2-4%), pain with intercourse (0-7%), and infection (<  1%). The risk of mesh exposure if more likely in a woman with risks for poor healing (prior radiation, poorly controlled diabetes, or immunocompromised). The risk of new or worsened chronic pain after mesh implant is more common in women with baseline chronic pain and/or  poorly controlled anxiety or depression. Approximately 2-4% of patients will experience longer-term post-operative voiding dysfunction that may require surgical revision of the sling. We also reviewed that postoperatively, her stream may not be as strong as before surgery.   Prolapse (with or without mesh): Risk factors for surgical failure  include things that put pressure on your pelvis and the surgical repair, including obesity, chronic cough, and heavy lifting or straining (including lifting children or adults, straining on the toilet, or lifting heavy objects such as furniture or anything weighing >25 lbs. Risks of recurrence is 20-30% with vaginal native tissue repair and a less than 10% with sacrocolpopexy with mesh.     - For preop Visit:  She is required to have a visit within 30 days of her surgery.   Today we reviewed pre-operative preparation, peri-operative expectations, and post-operative instructions/recovery.  She was provided with instructional handouts. She understands not to take aspirin (>81mg ) or NSAIDs 7 days prior to surgery.  Prescriptions will be sent prior to surgery.  - Medical clearance: not required  - Anticoagulant use: No - Medicaid Hysterectomy form: No - Accepts blood transfusion: Yes - Expected length of stay: outpatient  Request sent for surgery scheduling.   Marguerita Beards, MD  Time spent: I spent 40 minutes dedicated to the care of this patient on the date of this encounter to include pre-visit review of records, face-to-face time with the patient discussing results of testing and surgery options and post visit documentation.

## 2020-12-21 ENCOUNTER — Telehealth: Payer: Self-pay | Admitting: *Deleted

## 2020-12-21 NOTE — Telephone Encounter (Signed)
Informed pt that her surgery had been scheduled for 01/03/21 @0730 .  Informed that she would receive a call from a pre-op nurse prior to her surgery with further instructions. Informed that an appointment had been made for covid testing on 12/30/20 @ 1:10.  Advised that she would need to quarantine from the time of her covid test until surgery. Informed of post-op appt on 02/14/21 @0900 .  Pt states that she has FMLA papers that need to be filled out. She is to have her husband bring them to our office. Advised that I would give her a copy of the map for covid testing and surgery information. Pt verbalized understanding.

## 2020-12-22 ENCOUNTER — Telehealth: Payer: Self-pay | Admitting: *Deleted

## 2020-12-22 ENCOUNTER — Other Ambulatory Visit: Payer: Self-pay | Admitting: Obstetrics and Gynecology

## 2020-12-22 DIAGNOSIS — Z01818 Encounter for other preprocedural examination: Secondary | ICD-10-CM

## 2020-12-22 MED ORDER — POLYETHYLENE GLYCOL 3350 17 GM/SCOOP PO POWD: 1.0000 | Freq: Once | ORAL | 0 refills | Status: AC

## 2020-12-22 MED ORDER — ACETAMINOPHEN 500 MG PO TABS: 500.0000 mg | ORAL_TABLET | Freq: Four times a day (QID) | ORAL | 0 refills | Status: DC | PRN

## 2020-12-22 MED ORDER — IBUPROFEN 600 MG PO TABS: 600.0000 mg | ORAL_TABLET | Freq: Four times a day (QID) | ORAL | 0 refills | Status: DC | PRN

## 2020-12-22 MED ORDER — OXYCODONE HCL 5 MG PO TABS: 5.0000 mg | ORAL_TABLET | ORAL | 0 refills | Status: DC | PRN

## 2020-12-22 NOTE — Telephone Encounter (Signed)
Informed pt that medications she will be taking after surgery were sent to her pharmacy.  Advised for her to pick those up prior to surgery. Pt verbalized understanding.

## 2020-12-22 NOTE — H&P (Signed)
[facility] Pre-Operative H&P  Subjective Chief Complaint: Anne Alexander presents for a preoperative encounter.   History of Present Illness: Anne Alexander is a 70 y.o. female who is scheduled to undergo Exam under anesthesia, sacrospinous ligament fixation, posterior repair, midurethral sling, cystoscopy,  possible anterior repair  on 01/03/21.  Her symptoms include vaginal bulge, urinary and anal incontinence, and she was was found to have Stage II anterior, Stage II posterior, Stage I apical prolapse .  Urodynamics showed: 1. Sensation wasincreased; capacity wasreduced 2. Stress Incontinencewasdemonstrated at normalpressures; 3. Detrusor Overactivitywas notdemonstrated. 4. Emptying wasadequate although she strained,with a normalPVR, a sustained detrusor contraction present, abdominal straining present,normalurethral sphincter activity on EMG.  Past Medical History:  Diagnosis Date  . Allergy   . Anxiety   . Cataract    forming very small   . COVID   . GERD (gastroesophageal reflux disease)   . High cholesterol   . Memory change   . Neuromuscular disorder (HCC)    hiatal hernia   . Osteopenia 06/2018   T score -2.1 FRAX 14% / 0.3%  . Sleep deficient   . Thyroid condition      Past Surgical History:  Procedure Laterality Date  . BLADDER SUSPENSION     Raz bladder suspension  . NECK SURGERY    . OOPHORECTOMY Bilateral age 32   endometriosis  . TONSILLECTOMY    . VAGINAL HYSTERECTOMY  age 57    is allergic to penicillins.   Family History  Problem Relation Age of Onset  . Dementia Mother   . Breast cancer Mother 49  . Alzheimer's disease Maternal Aunt   . Alzheimer's disease Maternal Grandfather   . Dementia Brother   . Heart Problems Sister     Social History   Tobacco Use  . Smoking status: Former Smoker    Packs/day: 1.00    Years: 20.00    Pack years: 20.00    Types: Cigarettes    Quit date: 03/24/2006    Years  since quitting: 14.7  . Smokeless tobacco: Never Used  Vaping Use  . Vaping Use: Never used  Substance Use Topics  . Alcohol use: No  . Drug use: No     Review of Systems was negative for a full 10 system review except as noted in the History of Present Illness.   Current Facility-Administered Medications:  .  0.9 %  sodium chloride infusion, 500 mL, Intravenous, Continuous, Meryl Dare, MD  Current Outpatient Medications:  .  cyclobenzaprine (FLEXERIL) 10 MG tablet, Take 10 mg by mouth every 8 (eight) hours as needed. , Disp: , Rfl:  .  diclofenac (VOLTAREN) 50 MG EC tablet, Take 50 mg by mouth 2 (two) times daily., Disp: , Rfl: 3 .  diphenhydrAMINE HCl (BENADRYL PO), Take by mouth., Disp: , Rfl:  .  Doxepin HCl 6 MG TABS, Take by mouth. , Disp: , Rfl:  .  doxycycline (VIBRA-TABS) 100 MG tablet, Take 100 mg by mouth daily., Disp: , Rfl:  .  levothyroxine (SYNTHROID, LEVOTHROID) 75 MCG tablet, Take 75 mcg by mouth daily before breakfast., Disp: , Rfl:  .  OVER THE COUNTER MEDICATION, ZZZ Quil at bedtime PRN, Disp: , Rfl:  .  Sodium Sulfate-Mag Sulfate-KCl (SUTAB) [phone] MG TABS, Take 24 tablets by mouth as directed. BIN: [id number] PCN: CN GROUP: [id number] MEMBER ID: [id number];YKD AS CASH;NO PRIOR AUTHORIZATION, Disp: 24 tablet, Rfl: 0   Objective   Previous Pelvic Exam showed: s/p hysterectomy:  Speculum exam reveals normal vaginal mucosa with  atrophy and normal vaginal cuff.  Adnexa no mass, fullness, tenderness.    With apex supported, anterior and posterior compartment defect was reduced   POP-Q:   POP-Q  -1                                            Aa   -1                                           Ba  -7.5                                              C   5                                            Gh  4.5                                            Pb  8.5                                            tvl   1                                             Ap  1                                            Bp                                                 D     Assessment/ Plan  Assessment: The patient is a 70 y.o. year old stage II POP and incontinence  Plan:   Exam under anesthesia, sacrospinous ligament fixation, posterior repair, midurethral sling, cystoscopy,  possible anterior repair.   Marguerita Beards, MD

## 2020-12-22 NOTE — Progress Notes (Unsigned)
Post op medications were sent to the pharmacy.

## 2020-12-29 ENCOUNTER — Other Ambulatory Visit: Payer: Self-pay

## 2020-12-29 ENCOUNTER — Encounter (HOSPITAL_BASED_OUTPATIENT_CLINIC_OR_DEPARTMENT_OTHER): Payer: Self-pay | Admitting: Obstetrics and Gynecology

## 2020-12-29 NOTE — Progress Notes (Signed)
Spoke w/ via phone for pre-op interview---pt Lab needs dos----     cbc          Lab results------none COVID test ------12-30-2020 1310 Arrive at -------530 am 01-03-2021 NPO after MN NO Solid Food.  Clear liquids from MN until---430 am then npo Medications to take morning of surgery -----levothyroxine Diabetic medication -----n/a Patient Special Instructions -----follow all bowel prep instructions from dr schroeder Pre-Op special Istructions -----none Patient verbalized understanding of instructions that were given at this phone interview. Patient denies shortness of breath, chest pain, fever, cough at this phone interview.

## 2020-12-30 ENCOUNTER — Other Ambulatory Visit (HOSPITAL_COMMUNITY)
Admission: RE | Admit: 2020-12-30 | Discharge: 2020-12-30 | Disposition: A | Discharge status: Home / Self Care | Payer: 59 | Source: Ambulatory Visit | Attending: Obstetrics and Gynecology | Admitting: Obstetrics and Gynecology

## 2020-12-30 DIAGNOSIS — Z01812 Encounter for preprocedural laboratory examination: Secondary | ICD-10-CM | POA: Diagnosis present

## 2020-12-30 DIAGNOSIS — Z20822 Contact with and (suspected) exposure to covid-19: Secondary | ICD-10-CM | POA: Diagnosis not present

## 2020-12-30 LAB — SARS CORONAVIRUS 2 (TAT 6-24 HRS): SARS Coronavirus 2: NEGATIVE

## 2020-12-31 ENCOUNTER — Telehealth: Payer: Self-pay | Admitting: Obstetrics and Gynecology

## 2020-12-31 NOTE — Telephone Encounter (Signed)
Tried calling 3x this am, line busy.  Will try again. KW CMA

## 2021-01-03 ENCOUNTER — Encounter (HOSPITAL_BASED_OUTPATIENT_CLINIC_OR_DEPARTMENT_OTHER)
Admission: RE | Disposition: A | Discharge status: Home / Self Care | Payer: Self-pay | Source: Home / Self Care | Attending: Obstetrics and Gynecology

## 2021-01-03 ENCOUNTER — Other Ambulatory Visit: Payer: Self-pay

## 2021-01-03 ENCOUNTER — Encounter (HOSPITAL_BASED_OUTPATIENT_CLINIC_OR_DEPARTMENT_OTHER): Payer: Self-pay | Admitting: Obstetrics and Gynecology

## 2021-01-03 ENCOUNTER — Ambulatory Visit (HOSPITAL_BASED_OUTPATIENT_CLINIC_OR_DEPARTMENT_OTHER): Payer: 59 | Admitting: Anesthesiology

## 2021-01-03 ENCOUNTER — Telehealth: Payer: Self-pay | Admitting: Obstetrics and Gynecology

## 2021-01-03 ENCOUNTER — Ambulatory Visit (HOSPITAL_BASED_OUTPATIENT_CLINIC_OR_DEPARTMENT_OTHER)
Admission: RE | Admit: 2021-01-03 | Discharge: 2021-01-03 | Disposition: A | Discharge status: Home / Self Care | Payer: 59 | Attending: Obstetrics and Gynecology | Admitting: Obstetrics and Gynecology

## 2021-01-03 ENCOUNTER — Encounter: Payer: Self-pay | Admitting: Obstetrics and Gynecology

## 2021-01-03 DIAGNOSIS — Z880 Allergy status to penicillin: Secondary | ICD-10-CM | POA: Insufficient documentation

## 2021-01-03 DIAGNOSIS — N993 Prolapse of vaginal vault after hysterectomy: Secondary | ICD-10-CM | POA: Insufficient documentation

## 2021-01-03 DIAGNOSIS — Z803 Family history of malignant neoplasm of breast: Secondary | ICD-10-CM | POA: Diagnosis not present

## 2021-01-03 DIAGNOSIS — Z8616 Personal history of COVID-19: Secondary | ICD-10-CM | POA: Insufficient documentation

## 2021-01-03 DIAGNOSIS — Z820 Family history of epilepsy and other diseases of the nervous system: Secondary | ICD-10-CM | POA: Diagnosis not present

## 2021-01-03 DIAGNOSIS — Z791 Long term (current) use of non-steroidal anti-inflammatories (NSAID): Secondary | ICD-10-CM | POA: Diagnosis not present

## 2021-01-03 DIAGNOSIS — Z87891 Personal history of nicotine dependence: Secondary | ICD-10-CM | POA: Insufficient documentation

## 2021-01-03 DIAGNOSIS — N816 Rectocele: Secondary | ICD-10-CM | POA: Diagnosis not present

## 2021-01-03 DIAGNOSIS — Z8249 Family history of ischemic heart disease and other diseases of the circulatory system: Secondary | ICD-10-CM | POA: Insufficient documentation

## 2021-01-03 DIAGNOSIS — N393 Stress incontinence (female) (male): Secondary | ICD-10-CM | POA: Diagnosis not present

## 2021-01-03 HISTORY — DX: Rosacea, unspecified: L71.9

## 2021-01-03 HISTORY — PX: BLADDER SUSPENSION: SHX72

## 2021-01-03 HISTORY — DX: Personal history of other diseases of the digestive system: Z87.19

## 2021-01-03 HISTORY — PX: RECTOCELE REPAIR: SHX761

## 2021-01-03 HISTORY — DX: Hypothyroidism, unspecified: E03.9

## 2021-01-03 HISTORY — DX: Cystocele, unspecified: N81.10

## 2021-01-03 HISTORY — DX: Presence of spectacles and contact lenses: Z97.3

## 2021-01-03 HISTORY — DX: Dermatitis, unspecified: L30.9

## 2021-01-03 HISTORY — PX: ANTERIOR AND POSTERIOR REPAIR WITH SACROSPINOUS FIXATION: SHX6536

## 2021-01-03 HISTORY — PX: CYSTOSCOPY: SHX5120

## 2021-01-03 HISTORY — DX: Stress incontinence (female) (male): N39.3

## 2021-01-03 LAB — CBC
HCT: 41.2 % (ref 36.0–46.0)
Hemoglobin: 13.4 g/dL (ref 12.0–15.0)
MCH: 30.3 pg (ref 26.0–34.0)
MCHC: 32.5 g/dL (ref 30.0–36.0)
MCV: 93.2 fL (ref 80.0–100.0)
Platelets: 238 10*3/uL (ref 150–400)
RBC: 4.42 MIL/uL (ref 3.87–5.11)
RDW: 12.3 % (ref 11.5–15.5)
WBC: 6.2 10*3/uL (ref 4.0–10.5)
nRBC: 0 % (ref 0.0–0.2)

## 2021-01-03 SURGERY — ANTERIOR AND POSTERIOR REPAIR WITH SACROSPINOUS FIXATION
Anesthesia: General | Site: Vagina

## 2021-01-03 MED ORDER — ACETAMINOPHEN 500 MG PO TABS
ORAL_TABLET | ORAL | Status: AC
  Filled 2021-01-03: qty 2

## 2021-01-03 MED ORDER — PHENAZOPYRIDINE HCL 100 MG PO TABS: 200.0000 mg | ORAL_TABLET | ORAL | Status: DC

## 2021-01-03 MED ORDER — EPHEDRINE 5 MG/ML INJ
INTRAVENOUS | Status: AC
  Filled 2021-01-03: qty 10

## 2021-01-03 MED ORDER — PHENAZOPYRIDINE HCL 100 MG PO TABS
ORAL_TABLET | ORAL | Status: AC
  Filled 2021-01-03: qty 2

## 2021-01-03 MED ORDER — PROPOFOL 10 MG/ML IV BOLUS
INTRAVENOUS | Status: DC | PRN
Start: 2021-01-03 — End: 2021-01-03
  Administered 2021-01-03: 120 mg via INTRAVENOUS

## 2021-01-03 MED ORDER — FENTANYL CITRATE (PF) 100 MCG/2ML IJ SOLN
INTRAMUSCULAR | Status: DC | PRN
  Administered 2021-01-03 (×2): 50 ug via INTRAVENOUS

## 2021-01-03 MED ORDER — ROCURONIUM BROMIDE 100 MG/10ML IV SOLN
INTRAVENOUS | Status: DC | PRN
  Administered 2021-01-03: 70 mg via INTRAVENOUS

## 2021-01-03 MED ORDER — GABAPENTIN 300 MG PO CAPS
ORAL_CAPSULE | ORAL | Status: AC
  Filled 2021-01-03: qty 1

## 2021-01-03 MED ORDER — OXYCODONE HCL 5 MG PO TABS: 5.0000 mg | ORAL_TABLET | Freq: Once | ORAL | Status: DC | PRN

## 2021-01-03 MED ORDER — OXYCODONE HCL 5 MG/5ML PO SOLN
5.0000 mg | Freq: Once | ORAL | Status: DC | PRN
Start: 2021-01-03 — End: 2021-01-03

## 2021-01-03 MED ORDER — LIDOCAINE HCL (CARDIAC) PF 100 MG/5ML IV SOSY
PREFILLED_SYRINGE | INTRAVENOUS | Status: DC | PRN
  Administered 2021-01-03: 100 mg via INTRAVENOUS

## 2021-01-03 MED ORDER — ONDANSETRON HCL 4 MG/2ML IJ SOLN
INTRAMUSCULAR | Status: DC | PRN
  Administered 2021-01-03: 4 mg via INTRAVENOUS

## 2021-01-03 MED ORDER — PHENYLEPHRINE 40 MCG/ML (10ML) SYRINGE FOR IV PUSH (FOR BLOOD PRESSURE SUPPORT)
PREFILLED_SYRINGE | INTRAVENOUS | Status: AC
  Filled 2021-01-03: qty 10

## 2021-01-03 MED ORDER — CEFAZOLIN SODIUM-DEXTROSE 2-4 GM/100ML-% IV SOLN
2.0000 g | INTRAVENOUS | Status: AC
  Administered 2021-01-03: 2 g via INTRAVENOUS

## 2021-01-03 MED ORDER — FENTANYL CITRATE (PF) 250 MCG/5ML IJ SOLN
INTRAMUSCULAR | Status: AC
  Filled 2021-01-03: qty 5

## 2021-01-03 MED ORDER — LIDOCAINE-EPINEPHRINE 1 %-1:100000 IJ SOLN
INTRAMUSCULAR | Status: DC | PRN
  Administered 2021-01-03: 20 mL

## 2021-01-03 MED ORDER — ACETAMINOPHEN 500 MG PO TABS
1000.0000 mg | ORAL_TABLET | ORAL | Status: AC
  Administered 2021-01-03: 1000 mg via ORAL

## 2021-01-03 MED ORDER — ONDANSETRON HCL 4 MG/2ML IJ SOLN: 4.0000 mg | Freq: Once | INTRAMUSCULAR | Status: DC | PRN

## 2021-01-03 MED ORDER — PROPOFOL 10 MG/ML IV BOLUS
INTRAVENOUS | Status: AC
  Filled 2021-01-03: qty 40

## 2021-01-03 MED ORDER — EPHEDRINE SULFATE 50 MG/ML IJ SOLN
INTRAMUSCULAR | Status: DC | PRN
  Administered 2021-01-03: 10 mg via INTRAVENOUS

## 2021-01-03 MED ORDER — PHENAZOPYRIDINE HCL 100 MG PO TABS
200.0000 mg | ORAL_TABLET | Freq: Once | ORAL | Status: AC
  Administered 2021-01-03: 200 mg via ORAL

## 2021-01-03 MED ORDER — SUGAMMADEX SODIUM 200 MG/2ML IV SOLN
INTRAVENOUS | Status: DC | PRN
  Administered 2021-01-03: 160 mg via INTRAVENOUS

## 2021-01-03 MED ORDER — GABAPENTIN 300 MG PO CAPS
300.0000 mg | ORAL_CAPSULE | ORAL | Status: AC
  Administered 2021-01-03: 300 mg via ORAL

## 2021-01-03 MED ORDER — LACTATED RINGERS IV SOLN: INTRAVENOUS | Status: DC

## 2021-01-03 MED ORDER — DEXAMETHASONE SODIUM PHOSPHATE 4 MG/ML IJ SOLN
INTRAMUSCULAR | Status: DC | PRN
  Administered 2021-01-03: 8 mg via INTRAVENOUS

## 2021-01-03 MED ORDER — MIDAZOLAM HCL 2 MG/2ML IJ SOLN
INTRAMUSCULAR | Status: DC | PRN
  Administered 2021-01-03: 1 mg via INTRAVENOUS

## 2021-01-03 MED ORDER — HYDROMORPHONE HCL 1 MG/ML IJ SOLN: 0.2500 mg | INTRAMUSCULAR | Status: DC | PRN

## 2021-01-03 MED ORDER — MIDAZOLAM HCL 2 MG/2ML IJ SOLN
INTRAMUSCULAR | Status: AC
  Filled 2021-01-03: qty 2

## 2021-01-03 MED ORDER — POVIDONE-IODINE 10 % EX SWAB: 2.0000 "application " | Freq: Once | CUTANEOUS | Status: DC

## 2021-01-03 SURGICAL SUPPLY — 51 items
ADH SKN CLS APL DERMABOND .7 (GAUZE/BANDAGES/DRESSINGS) ×4
AGENT HMST KT MTR STRL THRMB (HEMOSTASIS) ×4
BLADE SURG 15 STRL LF DISP TIS (BLADE) ×4 IMPLANT
BLADE SURG 15 STRL SS (BLADE) ×5
COVER SURGICAL LIGHT HANDLE (MISCELLANEOUS) ×1 IMPLANT
COVER WAND RF STERILE (DRAPES) ×5 IMPLANT
Capio Suture Capturing Device ×1 IMPLANT
Capio Violet Monofilament Polydiaxanone Suture ×2 IMPLANT
DECANTER SPIKE VIAL GLASS SM (MISCELLANEOUS) ×5 IMPLANT
DERMABOND ADVANCED (GAUZE/BANDAGES/DRESSINGS) ×1
DERMABOND ADVANCED .7 DNX12 (GAUZE/BANDAGES/DRESSINGS) ×4 IMPLANT
DEVICE CAPIO SLIM BOX (INSTRUMENTS) ×1 IMPLANT
DEVICE CAPIO SLIM SINGLE (INSTRUMENTS) IMPLANT
GAUZE PACKING 2X5 YD STRL (GAUZE/BANDAGES/DRESSINGS) IMPLANT
GLOVE SURG ENC MOIS LTX SZ6 (GLOVE) ×5 IMPLANT
GLOVE SURG ENC MOIS LTX SZ7.5 (GLOVE) ×1 IMPLANT
GLOVE SURG LTX SZ6 (GLOVE) ×5 IMPLANT
GLOVE SURG UNDER POLY LF SZ6.5 (GLOVE) ×5 IMPLANT
GLOVE SURG UNDER POLY LF SZ7 (GLOVE) ×7 IMPLANT
GOWN STRL REUS W/TWL LRG LVL3 (GOWN DISPOSABLE) ×15 IMPLANT
HIBICLENS CHG 4% 4OZ (MISCELLANEOUS) ×5 IMPLANT
HOLDER FOLEY CATH W/STRAP (MISCELLANEOUS) ×1 IMPLANT
IV NS 1000ML (IV SOLUTION) ×5
IV NS 1000ML BAXH (IV SOLUTION) IMPLANT
KIT TURNOVER CYSTO (KITS) ×5 IMPLANT
MANIFOLD NEPTUNE II (INSTRUMENTS) ×1 IMPLANT
NDL MAYO 6 CRC TAPER PT (NEEDLE) IMPLANT
NEEDLE HYPO 22GX1.5 SAFETY (NEEDLE) ×5 IMPLANT
NEEDLE MAYO 6 CRC TAPER PT (NEEDLE) ×5 IMPLANT
NS IRRIG 500ML POUR BTL (IV SOLUTION) ×1 IMPLANT
PACK VAGINAL WOMENS (CUSTOM PROCEDURE TRAY) ×5 IMPLANT
PAD OB MATERNITY 4.3X12.25 (PERSONAL CARE ITEMS) ×1 IMPLANT
RETRACTOR LONE STAR DISPOSABLE (INSTRUMENTS) ×5 IMPLANT
RETRACTOR STAY HOOK 5MM (MISCELLANEOUS) ×1 IMPLANT
SET IRRIG Y TYPE TUR BLADDER L (SET/KITS/TRAYS/PACK) ×5 IMPLANT
SURGIFLO W/THROMBIN 8M KIT (HEMOSTASIS) ×1 IMPLANT
SUT ABS MONO DBL WITH NDL 48IN (SUTURE) ×2 IMPLANT
SUT CAPIO PGA 48IN SZ 0 (SUTURE) ×4 IMPLANT
SUT CV-0 GORETEX TFX25 36 (SUTURE) IMPLANT
SUT MON AB 2-0 SH 27 (SUTURE) IMPLANT
SUT VIC AB 0 CT1 27 (SUTURE) ×5
SUT VIC AB 0 CT1 27XBRD ANTBC (SUTURE) ×4 IMPLANT
SUT VIC AB 2-0 SH 27 (SUTURE)
SUT VIC AB 2-0 SH 27XBRD (SUTURE) IMPLANT
SUT VIC AB 3-0 SH 18 (SUTURE) IMPLANT
SUT VICRYL 2-0 SH 8X27 (SUTURE) ×1 IMPLANT
SYR BULB EAR ULCER 3OZ GRN STR (SYRINGE) ×5 IMPLANT
SYR CONTROL 10ML LL (SYRINGE) ×1 IMPLANT
SYS SLING ADV FIT BLUE TRNSVAG (Sling) ×1 IMPLANT
TOWEL OR 17X26 10 PK STRL BLUE (TOWEL DISPOSABLE) ×5 IMPLANT
TRAY FOLEY W/BAG SLVR 14FR (SET/KITS/TRAYS/PACK) ×5 IMPLANT

## 2021-01-03 NOTE — Telephone Encounter (Signed)
Anne Alexander underwent a sacrospinous ligament fixation, posterior repair, midurethral sling and cystoscopy on 01/03/21.   She passed her voiding trial.  was backfilled into the bladder Voided  PVR by bladder scan was 31ml.   She was discharged without a catheter. Please call her for a routine post op check. Thanks!  Marguerita Beards, MD

## 2021-01-03 NOTE — Transfer of Care (Signed)
Immediate Anesthesia Transfer of Care Note  Patient: Anne Alexander  Procedure(s) Performed: SACROSPINOUS LIGAMENT FIXATION (N/A ) TRANSVAGINAL TAPE (TVT) PROCEDURE (N/A Vagina ) CYSTOSCOPY (N/A Urethra) POSTERIOR REPAIR (RECTOCELE)  Patient Location: PACU  Anesthesia Type:General  Level of Consciousness: awake, alert , oriented and patient cooperative  Airway & Oxygen Therapy: Patient Spontanous Breathing and Patient connected to nasal cannula oxygen  Post-op Assessment: Report given to RN and Post -op Vital signs reviewed and stable  Post vital signs: Reviewed and stable  Last Vitals:  Vitals Value Taken Time  BP 121/56 01/03/21 1030  Temp 36.4 C 01/03/21 0919  Pulse 70 01/03/21 1030  Resp 26 01/03/21 1030  SpO2 100 % 01/03/21 1030  Vitals shown include unvalidated device data.  Last Pain:  Vitals:   01/03/21 1015  TempSrc:   PainSc: 2       Patients Stated Pain Goal: 5 (01/03/21 1000)  Complications: No complications documented.

## 2021-01-03 NOTE — Anesthesia Preprocedure Evaluation (Signed)
Anesthesia Evaluation  Patient identified by MRN, date of birth, ID band Patient awake    Reviewed: Allergy & Precautions, H&P , NPO status , Patient's Chart, lab work & pertinent test results  Airway Mallampati: II  TM Distance: >3 FB Neck ROM: Full    Dental no notable dental hx.    Pulmonary neg pulmonary ROS, former smoker,    Pulmonary exam normal breath sounds clear to auscultation       Cardiovascular negative cardio ROS Normal cardiovascular exam Rhythm:Regular Rate:Normal     Neuro/Psych negative neurological ROS  negative psych ROS   GI/Hepatic Neg liver ROS, GERD  ,  Endo/Other  Hypothyroidism   Renal/GU negative Renal ROS  negative genitourinary   Musculoskeletal negative musculoskeletal ROS (+)   Abdominal   Peds negative pediatric ROS (+)  Hematology negative hematology ROS (+)   Anesthesia Other Findings   Reproductive/Obstetrics negative OB ROS                             Anesthesia Physical Anesthesia Plan  ASA: II  Anesthesia Plan: General   Post-op Pain Management:    Induction: Intravenous  PONV Risk Score and Plan: 3 and Ondansetron and Dexamethasone  Airway Management Planned: Oral ETT  Additional Equipment:   Intra-op Plan:   Post-operative Plan: Extubation in OR  Informed Consent: I have reviewed the patients History and Physical, chart, labs and discussed the procedure including the risks, benefits and alternatives for the proposed anesthesia with the patient or authorized representative who has indicated his/her understanding and acceptance.     Dental advisory given  Plan Discussed with: CRNA and Surgeon  Anesthesia Plan Comments:         Anesthesia Quick Evaluation

## 2021-01-03 NOTE — Op Note (Signed)
Operative Note  Preoperative Diagnosis: posterior vaginal prolapse, vaginal vault prolapse after hysterectomy and stress urinary incontinence  Postoperative Diagnosis: same  Procedures performed: Sacrospinous ligament fixation, posterior repair and perineorrhaphy, midurethral sling and cystoscopy  Implants:  Implant Name Type Inv. Item Serial No. Manufacturer Lot No. LRB No. Used Action  SYS SLING ADV FIT BLUE TRNSVAG - [id number] Sling SYS SLING ADV FIT BLUE TRNSVAG  [employer] [id number] N/A 1 Implanted    Attending Surgeon: Lanetta Inch, MD  Anesthesia: General endotracheal  Findings: 1. Stage II Pelvic organ prolapse on pelvic exam  2. On cystourethroscopy, normal bladder and urethra without injury, lesion or foreign body. Brisk bilateral ureteral efflux noted.   Specimens: none  Estimated blood loss: 75 mL  IV fluids: 1300 mL  Urine output: 150 mL  Complications: none  Procedure in Detail:  After informed consent was obtained, the patient was taken to the operating room where anesthesia was induced and found to be adequate. She was placed in dorsal lithotomy position, taking care to avoid any traction on the extremities, and then prepped and draped in the usual sterile fashion. A self-retaining lonestar retractor was placed using four elastic blue stays.  After a foley catheter was inserted into the urethra, the location of the midurethra was palpated. Two Allis clamps were placed along the posterior vaginal wall defect. 1% lidocaine with epinephrine was injected into the vaginal mucosa.  A vertical incision was made between these two Allis clamps with a 15 blade scalpel.  Allis clamps were placed along this incision and Metzenbaum scissors were used to undermine the vaginal mucosa along the incision.  The vaginal mucosa was then sharply dissected off to the rectovaginal septum bilaterally.    For the sacrospinous ligament fixation (SSLF), the ischial spine was accessed  on the right side via dissection with Metzenbaum scissors and blunt dissection.  The sacrospinous ligament was palpated. Two 0 PDS suture was then placed at the sacrospinous ligament two fingerbreadths medial to the ischial spine, in order to avoid the pudendal neurovascular bundle, using a Capio needle driver.  The PDS suture was attached to the vaginal epithelium on the ipsilateral side of the vaginal apex and held. Plication of the rectovaginal septum was then performed using a pursesting suture to reduce the enterocele sac, then plicating sutures of 2-0 Vicryl along the rectovaginal septum. The vaginal mucosal edges were trimmed and the incision reapproximated with 2-0 Vicryl in a running fashion. The SSLF suture was then tied down with excellent support of the posterior and apical vagina.  The retropubic midurethral sling was performed next. Two Allis clamps were placed lateral to the location of the midurethra. 1% lidocaine with epinephrine was injected into the vaginal mucosa and laterally.  Using a 15 blade scapel a small vertical incision was made in the vaginal mucosa. Metzenbaum scissors were used to dissect the vaginal mucosa off of the underlying muscularis and to create the periurethral tunnels. The trocar and attached sling were introduced into the right side of the vaginal incision, just inferior to the pubic symphysis on the right side. The trocar was guided through the endopelvic fascia and directly behind the pubic symphysis through the retropubic space, vertically.  The trocar was guided out through the suprapubic region, 2 fingerbreadths lateral to midline at the level of the pubic symphysis on the ipsilateral side. The trocar was similarly placed on the left side.  The Foley catheter was removed.  A 70-degree cystoscope was introduced, and 360-degree inspection revealed  no trauma or trocars in the bladder, with bilateral ureteral efflux.  The bladder was drained and the cystoscope was  removed.  The Foley catheter was reinserted. The sling was brought to lie beneath the mid-urethra.  An empty needle driver was placed behind the sling to ensure a tension free placement. The plastic sheath was removed from the sling and the distal ends of the sling were trimmed just below the level of the skin incisions. The skin incisions were closed with dermabond. Surgiflo was placed into the vaginal incision to assist with hemostasis.  Hemostasis was noted.  Tension-free positioning of the sling was confirmed.  The vaginal incision was closed with 2-0 vicryl.  Attention was then turned to the perineum.  Two Allis clamps were placed at the hymenal ring.  1% lidocaine with epinephrine was injected into the vaginal mucosa. An inverted triangle incision was made between these clamps with a 15 blade scalpel and the skin was removed. The perineal body was reapproximated with a 0-vicryl suture.  2-0 vicryl suture was used to close the skin in a subcuticular fashion.  The vagina was copiously irrigated.  Hemostasis was noted.  Vaginal packing was not placed.  A rectal examination was normal and confirmed no sutures within the rectum.  The patient tolerated the procedure well.  She was awakened from anesthesia and transferred to the recovery room in stable condition. All counts were correct x 2.

## 2021-01-03 NOTE — Anesthesia Postprocedure Evaluation (Signed)
Anesthesia Post Note  Patient: Anne Alexander  Procedure(s) Performed: SACROSPINOUS LIGAMENT FIXATION (N/A ) TRANSVAGINAL TAPE (TVT) PROCEDURE (N/A Vagina ) CYSTOSCOPY (N/A Urethra) POSTERIOR REPAIR (RECTOCELE)     Patient location during evaluation: PACU Anesthesia Type: General Level of consciousness: awake and alert Pain management: pain level controlled Vital Signs Assessment: post-procedure vital signs reviewed and stable Respiratory status: spontaneous breathing, nonlabored ventilation, respiratory function stable and patient connected to nasal cannula oxygen Cardiovascular status: blood pressure returned to baseline and stable Postop Assessment: no apparent nausea or vomiting Anesthetic complications: no   No complications documented.  Last Vitals:  Vitals:   01/03/21 1000 01/03/21 1015  BP: (!) 152/69 (!) 142/67  Pulse: 65 61  Resp: 14 14  Temp:    SpO2: 96% 96%    Last Pain:  Vitals:   01/03/21 1015  TempSrc:   PainSc: 2                  Chelsye Suhre S

## 2021-01-03 NOTE — Discharge Instructions (Signed)
POST OPERATIVE INSTRUCTIONS  General Instructions . Recovery (not bed rest) will last approximately 6 weeks . Walking is encouraged, but refrain from strenuous exercise/ housework/ heavy lifting. o No lifting >10lbs  . Nothing in the vagina- NO intercourse, tampons or douching . Bathing:  Do not submerge in water (NO swimming, bath, hot tub, etc) until after your postop visit. You can shower starting the day after surgery.  . No driving until you are not taking narcotic pain medicine and until your pain is well enough controlled that you can slam on the breaks or make sudden movements if needed.   Taking your medications 1. Please take your acetaminophen and ibuprofen on a schedule for the first 48 hours. Take 600mg ibuprofen, then take 500mg acetaminophen 3 hours later, then continue to alternate ibuprofen and acetaminophen. That way you are taking each type of medication every 6 hours. 2. Take the prescribed narcotic (oxycodone, tramadol, etc) as needed, with a maximum being every 4 hours.  3. Take a stool softener daily to keep your stools soft and preventing you from straining. If you have diarrhea, you decrease your stool softener. This is explained more below. We have prescribed you Miralax.  Reasons to Call the Nurse (see last page for phone numbers) . Heavy Bleeding (changing your pad every 1-2 hours) . Persistent nausea/vomiting . Fever (100.4 degrees or more) . Incision problems (pus or other fluid coming out, redness, warmth, increased pain)  Things to Expect After Surgery . Mild to Moderate pain is normal during the first day or two after surgery. If prescribed, take Ibuprofen or Tylenol first and use the stronger medicine for "break-through" pain. You can overlap these medicines because they work differently.   . Constipation   . To Prevent Constipation:  Eat a well-balanced diet including protein, grains, fresh fruit and vegetables.  Drink plenty of fluids. Walk regularly.   Depending on specific instructions from your physician: take Miralax daily and additionally you can add a stool softener (colace/ docusate) and fiber supplement. Continue as long as you're on pain medications.   . To Treat Constipation:  If you do not have a bowel movement in 2 days after surgery, you can take 2 Tbs of Milk of Magnesia 1-2 times a day until you have a bowel movement. If diarrhea occurs, decrease the amount or stop the laxative. If no results with Milk of Magnesia, you can drink a bottle of magnesium citrate which you can purchase over the counter.  . Fatigue:  This is a normal response to surgery and will improve with time.  Plan frequent rest periods throughout the day.  . Gas Pain:  This is very common but can also be very painful! Drink warm liquids such as herbal teas, bouillon or soup. Walking will help you pass more gas.  Mylicon or Gas-X can be taken over the counter.  . Leaking Urine:  Varying amounts of leakage may occur after surgery.  This should improve with time. Your bladder needs at least 3 months to recover from surgery. If you leak after surgery, be sure to mention this to your doctor at your post-op visit. If you were taking medications for overactive bladder prior to surgery, be sure to restart the medications immediately after surgery.  . Incisions: If you have incisions on your abdomen, the skin glue will dissolve on its own over time. It is ok to gently rinse with soap and water over these incisions but do not scrub.  Catheter Approximately 50%   of patients are unable to urinate after surgery and need to go home with a catheter. This allows your bladder to rest so it can return to full function. If you go home with a catheter, the office will call to set up a voiding trial a few days after surgery. For most patients, by this visit, they are able to urinate on their own. Long term catheter use is rare.   Return to Work  As work demands and recovery times vary  widely, it is hard to predict when you will want to return to work. If you have a desk job with no strenuous physical activity, and if you would like to return sooner than generally recommended, discuss this with your provider or call our office.   Post op concerns  For non-emergent issues, please call the Urogynecology Nurse. Please leave a message and someone will contact you within one business day.  You can also send a message through MyChart.   AFTER HOURS (After 5:00 PM and on weekends):  For urgent matters that cannot wait until the next business day. Call our office [phone] and connect to the doctor on call.  Please reserve this for important issues.   **FOR ANY TRUE EMERGENCY ISSUES CALL 911 OR GO TO THE NEAREST EMERGENCY ROOM.** Please inform our office or the doctor on call of any emergency.     APPOINTMENTS: Call [phone]  Post Anesthesia Home Care Instructions  Activity: Get plenty of rest for the remainder of the day. A responsible individual must stay with you for 24 hours following the procedure.  For the next 24 hours, DO NOT: -Drive a car -Operate machinery -Drink alcoholic beverages -Take any medication unless instructed by your physician -Make any legal decisions or sign important papers.  Meals: Start with liquid foods such as gelatin or soup. Progress to regular foods as tolerated. Avoid greasy, spicy, heavy foods. If nausea and/or vomiting occur, drink only clear liquids until the nausea and/or vomiting subsides. Call your physician if vomiting continues.  Special Instructions/Symptoms: Your throat may feel dry or sore from the anesthesia or the breathing tube placed in your throat during surgery. If this causes discomfort, gargle with warm salt water. The discomfort should disappear within 24 hours.       

## 2021-01-03 NOTE — Anesthesia Procedure Notes (Signed)
Procedure Name: Intubation Date/Time: 01/03/2021 7:39 AM Performed by: Georgeanne Nim, CRNA Pre-anesthesia Checklist: Patient identified, Emergency Drugs available, Suction available, Patient being monitored and Timeout performed Patient Re-evaluated:Patient Re-evaluated prior to induction Oxygen Delivery Method: Circle system utilized Preoxygenation: Pre-oxygenation with 100% oxygen Induction Type: IV induction Ventilation: Mask ventilation without difficulty Laryngoscope Size: Mac and 4 Grade View: Grade I Tube size: 7.0 mm Number of attempts: 1 Airway Equipment and Method: Stylet Placement Confirmation: ETT inserted through vocal cords under direct vision,  positive ETCO2,  CO2 detector and breath sounds checked- equal and bilateral Secured at: 20 cm Tube secured with: Tape Dental Injury: Teeth and Oropharynx as per pre-operative assessment

## 2021-01-03 NOTE — Interval H&P Note (Signed)
History and Physical Interval Note:  01/03/2021 7:00 AM  Anne Alexander  has presented today for surgery, with the diagnosis of vaginal prolapse after hysterectomy, prolapse of posterior vaginal wall, prolapse of anterior vaginal wall, stress urinary incontinence.  The various methods of treatment have been discussed with the patient and family. After consideration of risks, benefits and other options for treatment, the patient has consented to  Procedure(s) with comments: ANTERIOR AND POSTERIOR REPAIR WITH SACROSPINOUS FIXATION (N/A) - total time needed for all 4 procedures is 2 hours TRANSVAGINAL TAPE (TVT) PROCEDURE (N/A) CYSTOSCOPY (N/A) POSTERIOR REPAIR (RECTOCELE) (N/A) as a surgical intervention.  The patient's history has been reviewed, patient examined, no change in status, stable for surgery.    Vitals:   01/03/21 0612  BP: (!) 157/79  Pulse: 64  Resp: 16  Temp: 97.7 F (36.5 C)  SpO2: 100%   Vitals:   01/03/21 0612  BP: (!) 157/79  Pulse: 64  Resp: 16  Temp: 97.7 F (36.5 C)  SpO2: 100%    Gen: NAD CV: S1 S2 RRR Lungs: Clear to auscultation bilaterally Abd: soft, nontender   I have reviewed the patient's chart and labs.  Questions were answered to the patient's satisfaction.     Marguerita Beards

## 2021-01-04 ENCOUNTER — Encounter (HOSPITAL_BASED_OUTPATIENT_CLINIC_OR_DEPARTMENT_OTHER): Payer: Self-pay | Admitting: Obstetrics and Gynecology

## 2021-01-04 NOTE — Telephone Encounter (Signed)
Post- Op Call  Judd Gaudier underwent sacrospinous ligament fixation, posterior repair, midurethral sling and cystoscopy on 01/03/2021 with Dr Florian Buff. The patient reports that her pain is controlled. She is taking no pain medication. She reports vaginal very minimal bleeding with no concerns. Pt said she has not blood while urinating. She has not had a bowel movement but is passing gas and is not taking a bowel regimen. She was discharged without a catheter.  Salina April, CMA

## 2021-01-10 ENCOUNTER — Telehealth (HOSPITAL_BASED_OUTPATIENT_CLINIC_OR_DEPARTMENT_OTHER): Payer: Self-pay | Admitting: *Deleted

## 2021-01-10 NOTE — Telephone Encounter (Signed)
Pt called to confirm that post-op appt was on 02/14/21 and not on March 11.  I confirmed that it was 02/14/21 at 0900. She also wanted to let us know that all was going well.

## 2021-01-24 ENCOUNTER — Telehealth: Payer: Self-pay

## 2021-01-24 DIAGNOSIS — R350 Frequency of micturition: Secondary | ICD-10-CM

## 2021-01-24 MED ORDER — SULFAMETHOXAZOLE-TRIMETHOPRIM 800-160 MG PO TABS: 1.0000 | ORAL_TABLET | Freq: Two times a day (BID) | ORAL | 0 refills | Status: AC

## 2021-01-24 NOTE — Telephone Encounter (Signed)
Called patient. She reports that she has been having a fever and some back pain in the area of her kidneys. Does not have dysuria but is having increasing urinary frequency and urgency, was up 5 times last night. Feels that she is emptying her bladder well. Will treat for urinary tract infection with bactrim 500 BID x7 days. [facility]. She also has some pressure sensation in the vaginal/ rectal area. Has been having normal bowel movements. Will plan for her to come to office tomorrow for an exam.   Marguerita Beards, MD

## 2021-01-24 NOTE — Telephone Encounter (Signed)
Anne Alexander is a 70 y.o. female complains of a fever 101.4 and pain when she stands she has pain between her rectun and vagina. Pt took a rapid covid test and it was negative. Pt complains of brown or caramel discharge she also said she feels exhausted.

## 2021-01-24 NOTE — Telephone Encounter (Signed)
Called pt and scheduled an appt for her to see Dr. Florian Buff in regards to the problems she is having.

## 2021-01-25 ENCOUNTER — Other Ambulatory Visit: Payer: Self-pay

## 2021-01-25 ENCOUNTER — Ambulatory Visit (INDEPENDENT_AMBULATORY_CARE_PROVIDER_SITE_OTHER): Payer: 59 | Admitting: Obstetrics and Gynecology

## 2021-01-25 ENCOUNTER — Encounter: Payer: Self-pay | Admitting: Obstetrics and Gynecology

## 2021-01-25 VITALS — BP 122/73 | HR 64 | Temp 98.3°F | Ht 65.5 in | Wt 175.0 lb

## 2021-01-25 DIAGNOSIS — N9989 Other postprocedural complications and disorders of genitourinary system: Secondary | ICD-10-CM

## 2021-01-25 DIAGNOSIS — N3000 Acute cystitis without hematuria: Secondary | ICD-10-CM

## 2021-01-25 NOTE — Progress Notes (Signed)
Anne Alexander is a 70 y.o. female here for a post op.

## 2021-01-25 NOTE — Telephone Encounter (Addendum)
RIKI BERNINGER is a 71 y.o. female was called on 01/24/21 at 2:40pm because a patient canceled and there was an opening earlier in the day. I offered Ms. Katzman a sooner appointment at 3pm instead of 4:20pm and she said that time would work for her. Pt said she will be bringing her husband.   Appointment was changed from 4:20pm to 3pm on 01/25/2021.

## 2021-01-25 NOTE — Progress Notes (Signed)
[facility]  Date of Visit: 01/25/2021  History of Present Illness: Anne Alexander is a 70 y.o. female scheduled today for a post-operative visit.   Surgery: s/p sacrospinous ligament fixation, posterior repair, midurethral sling and cystoscopy on 01/03/2021   Yesterday she developed a fever to 101.4. She also had some mid back pain and increased urinary frequency. She feels she is able to empty her bladder. Bactrim prescribed BID x 7days yesterday for possible urinary tract infection. Her temp has increased to 99 today but no longer having a fever. Back pain has improved. Has some vaginal pressure as well and some clear yellow vaginal discharge, which has been present since the surgery.    Medications: She has a current medication list which includes the following prescription(s): acetaminophen, diphenhydramine hcl, ibuprofen, levothyroxine, OVER THE COUNTER MEDICATION, OVER THE COUNTER MEDICATION, sutab, sulfamethoxazole-trimethoprim, cyclobenzaprine, diclofenac, and oxycodone.   Allergies: Patient is allergic to penicillins.   Physical Exam: BP 122/73   Pulse 64   Temp 98.3 F (36.8 C)   Ht 5' 5.5" (1.664 m)   Wt 175 lb (79.4 kg)   BMI 28.68 kg/m   Abdomen: soft, non-tender, without masses or organomegaly. No CVA tenderness Incisions: suprapubic incisions healing well. .  Pelvic Examination: Vagina: Incisions healing well, no erythema or drainage present. Sutures are present at incision line and there is a small amount of granulation tissue on the posterior vaginal wall which is nontende. No tenderness along the anterior or posterior vagina. No apical tenderness. No pelvic masses. No visible or palpable mesh. Rectal exam: no mass or fullness in rectovaginal septum.  ---------------------------------------------------------  Assessment and Plan:  1. Acute cystitis without hematuria   2. Other postoperative complication involving genitourinary system    - She was unable  to provide a urine sample today but has been taking antibiotics for over a day. We discussed that if her symptoms do not improve after taking the antibiotics then we will need a urine sample for culture.  - No other signs of infection on exam today, healing well overall.    Return 4/11 for post op visit.   Anne Beards, MD

## 2021-02-01 ENCOUNTER — Other Ambulatory Visit (HOSPITAL_COMMUNITY): Payer: Self-pay | Admitting: Internal Medicine

## 2021-02-01 DIAGNOSIS — I6523 Occlusion and stenosis of bilateral carotid arteries: Secondary | ICD-10-CM

## 2021-02-02 ENCOUNTER — Other Ambulatory Visit: Payer: Self-pay

## 2021-02-02 ENCOUNTER — Ambulatory Visit (HOSPITAL_COMMUNITY)
Admission: RE | Admit: 2021-02-02 | Discharge: 2021-02-02 | Disposition: A | Discharge status: Home / Self Care | Payer: 59 | Source: Ambulatory Visit | Attending: Internal Medicine | Admitting: Internal Medicine

## 2021-02-02 DIAGNOSIS — I6523 Occlusion and stenosis of bilateral carotid arteries: Secondary | ICD-10-CM | POA: Diagnosis not present

## 2021-02-09 ENCOUNTER — Encounter: Payer: 59 | Admitting: Obstetrics and Gynecology

## 2021-02-09 NOTE — Progress Notes (Signed)
[facility]  Date of Visit: 02/14/2021  History of Present Illness: [name] is a 70 y.o. female scheduled today for a post-operative visit.   Surgery: s/p Sacrospinous ligament fixation, posterior repair and perineorrhaphy, midurethral sling and cystoscopy on 01/03/21  She passed her postoperative void trial.   Postoperative course: was treated for a UTI for increased urinary urgency and frequency.   Has been taking miralax regularly. Still has some straining to defecate but is trying not to push. . Has been eating a lot of fruits and vegetables. Has been using a stool under her feet with bowel movements.   UTI in the last 6 weeks? No  Pain? No  She has not returned to her normal activity (except for postop restrictions) Vaginal bulge? No  Stress incontinence: No  Urgency/frequency: Yes  Urge incontinence: Yes - occasional Voiding dysfunction: No  Bowel issues: see above  Subjective Success: Do you usually have a bulge or something falling out that you can see or feel in the vaginal area? No    Retreatment Success: Any retreatment with surgery or pessary for any compartment? No   Pathology results: no pathology submitted.   Medications: She has a current medication list which includes the following prescription(s): acetaminophen, cyclobenzaprine, diclofenac, diphenhydramine hcl, ibuprofen, levothyroxine, OVER THE COUNTER MEDICATION, OVER THE COUNTER MEDICATION, oxycodone, and sutab.   Allergies: Patient is allergic to penicillins.   Physical Exam: BP (!) 142/69   Pulse 64   Wt 177 lb (80.3 kg)   BMI 29.01 kg/m   Incisions: suprapubic incisions well healed.  Pelvic Examination: Vagina: Incisions healing well. Sutures are present at incision line posteriorly but no sutures present apically and there is not granulation tissue. No tenderness along the anterior or posterior vagina. No apical tenderness. No pelvic masses. No visible or palpable  mesh.  POP-Q: POP-Q  -1.5                                            Aa   -1.5                                           Ba  -6.5                                              C   3.5                                            Gh  5.5                                            Pb  8                                            tvl   -2.5  Ap  -2.5                                            Bp                                                 D    ---------------------------------------------------------  Assessment and Plan:  1. Post-operative state     - Can resume regular activity including exercise and intercourse,  if desired.  - Discussed avoidance of heavy lifting and straining long term to reduce the risk of recurrence.   All questions answered. Return as needed.     Marguerita Beards, MD

## 2021-02-14 ENCOUNTER — Ambulatory Visit (INDEPENDENT_AMBULATORY_CARE_PROVIDER_SITE_OTHER): Payer: 59 | Admitting: Obstetrics and Gynecology

## 2021-02-14 ENCOUNTER — Encounter: Payer: Self-pay | Admitting: Obstetrics and Gynecology

## 2021-02-14 ENCOUNTER — Other Ambulatory Visit: Payer: Self-pay

## 2021-02-14 VITALS — BP 142/69 | HR 64 | Wt 177.0 lb

## 2021-02-14 DIAGNOSIS — Z9889 Other specified postprocedural states: Secondary | ICD-10-CM

## 2021-08-22 ENCOUNTER — Encounter: Payer: Self-pay | Admitting: Nurse Practitioner

## 2021-08-22 ENCOUNTER — Other Ambulatory Visit: Payer: Self-pay

## 2021-08-22 ENCOUNTER — Ambulatory Visit (INDEPENDENT_AMBULATORY_CARE_PROVIDER_SITE_OTHER): Payer: 59 | Admitting: Nurse Practitioner

## 2021-08-22 VITALS — BP 122/78 | Ht 65.0 in | Wt 174.0 lb

## 2021-08-22 DIAGNOSIS — Z01419 Encounter for gynecological examination (general) (routine) without abnormal findings: Secondary | ICD-10-CM | POA: Diagnosis not present

## 2021-08-22 DIAGNOSIS — Z780 Asymptomatic menopausal state: Secondary | ICD-10-CM

## 2021-08-22 DIAGNOSIS — M8588 Other specified disorders of bone density and structure, other site: Secondary | ICD-10-CM

## 2021-08-22 NOTE — Progress Notes (Signed)
Anne Alexander 11-Apr-1951 [id number]   History:  70 y.o. G1P1001 presents for annual exam. Postmenopausal - no HRT. S/P TAH BSO for endometriosis in her 71s. Normal pap history. History of hypothyroidism. 12/2020 bladder suspension, and anterior/posterior repair. She has noticed an area in upper right breast that feels bumpy. She notices it months ago. Denies other breast symptoms. Normal mammogram history.   Gynecologic History No LMP recorded. Patient has had a hysterectomy.   Contraception: status post hysterectomy  Health Maintenance Last Pap: 05/11/2017. Results were: Normal Last mammogram: 10/27/2020. Results were: Normal Last colonoscopy: 06/22/2020. Results were: Normal, 10-year recall Last Dexa: 10/19/2020. Results were: T-score -0.7, FRAX 7.8% / 0.5% (spine -1.6)  Past medical history, past surgical history, family history and social history were all reviewed and documented in the EPIC chart. Married. Works for [employer]. Daughter, lives local. Lucila [place] at [facility], plays golf. Mother diagnosed with breast cancer at age 34.   ROS:  A ROS was performed and pertinent positives and negatives are included.  Exam:  Vitals:   08/22/21 0818  BP: 122/78  Weight: 174 lb (78.9 kg)  Height: 5\' 5"  (1.651 m)   Body mass index is 28.96 kg/m.  General appearance:  Normal Thyroid:  Symmetrical, normal in size, without palpable masses or nodularity. Respiratory  Auscultation:  Clear without wheezing or rhonchi Cardiovascular  Auscultation:  Regular rate, without rubs, murmurs or gallops  Edema/varicosities:  Not grossly evident Abdominal  Soft,nontender, without masses, guarding or rebound.  Liver/spleen:  No organomegaly noted  Hernia:  None appreciated  Skin  Inspection:  Grossly normal Breasts: Examined lying and sitting.   Right: Without masses, retractions, nipple discharge or axillary adenopathy. Area of fibrous tissue in upper right breast.    Left: Without  masses, retractions, nipple discharge or axillary adenopathy. Genitourinary   Inguinal/mons:  Normal without inguinal adenopathy  External genitalia:  Normal appearing vulva with no masses, tenderness, or lesions  BUS/Urethra/Skene's glands:  Normal  Vagina:  Normal appearing with normal color and discharge, no lesions. Some vaginal wall prolapse  Cervix:  Absent  Uterus:  Absent  Adnexa/parametria:     Rt: Normal in size, without masses or tenderness.   Lt: Normal in size, without masses or tenderness.  Anus and perineum: Normal  Digital rectal exam: Normal sphincter tone without palpated masses or tenderness  Patient informed chaperone available to be present for breast and pelvic exam. Patient has requested no chaperone to be present. Patient has been advised what will be completed during breast and pelvic exam.   Assessment/Plan:  70 y.o. G1P1001 for annual exam.   Well female exam with routine gynecological exam - Education provided on SBEs, importance of preventative screenings, current guidelines, high calcium diet, regular exercise, and multivitamin daily.  Labs with PCP.  Postmenopausal - no HRT. S/P TAH BSO in her 3s for endometriosis.   Osteopenia of spine - Taking OTC supplement for bone health. Some improvement in spine. Will plan to repeat at 2-year interval.   Screening for cervical cancer - Normal Pap history. No longer screening per guidelines.   Screening for breast cancer - Normal mammogram history.  Continue annual screenings. Reassured that area in right upper breast is consistent with fibrous tissue.  Mother diagnosed with breast cancer at age 68.   Screening for colon cancer - 2021 colonoscopy. Will repeat at GI's recommended interval.   Return in 1 year for annual exam.   2022 DNP, 8:48 AM 08/22/2021

## 2021-08-23 ENCOUNTER — Ambulatory Visit: Payer: 59 | Admitting: Obstetrics and Gynecology

## 2021-09-06 ENCOUNTER — Ambulatory Visit: Payer: 59 | Admitting: Family Medicine

## 2021-10-07 ENCOUNTER — Other Ambulatory Visit (INDEPENDENT_AMBULATORY_CARE_PROVIDER_SITE_OTHER): Payer: 59

## 2021-10-07 ENCOUNTER — Other Ambulatory Visit: Payer: Self-pay | Admitting: Family Medicine

## 2021-10-07 ENCOUNTER — Ambulatory Visit (INDEPENDENT_AMBULATORY_CARE_PROVIDER_SITE_OTHER): Payer: 59 | Admitting: Family Medicine

## 2021-10-07 ENCOUNTER — Encounter: Payer: Self-pay | Admitting: Family Medicine

## 2021-10-07 VITALS — BP 122/70 | HR 64 | Temp 98.0°F | Resp 16 | Ht 65.0 in | Wt 178.0 lb

## 2021-10-07 DIAGNOSIS — E785 Hyperlipidemia, unspecified: Secondary | ICD-10-CM

## 2021-10-07 DIAGNOSIS — G4700 Insomnia, unspecified: Secondary | ICD-10-CM

## 2021-10-07 DIAGNOSIS — R232 Flushing: Secondary | ICD-10-CM

## 2021-10-07 DIAGNOSIS — E039 Hypothyroidism, unspecified: Secondary | ICD-10-CM

## 2021-10-07 DIAGNOSIS — R350 Frequency of micturition: Secondary | ICD-10-CM

## 2021-10-07 DIAGNOSIS — Z1231 Encounter for screening mammogram for malignant neoplasm of breast: Secondary | ICD-10-CM

## 2021-10-07 LAB — CBC WITH DIFFERENTIAL/PLATELET
Basophils Absolute: 0 10*3/uL (ref 0.0–0.1)
Basophils Relative: 0.5 % (ref 0.0–3.0)
Eosinophils Absolute: 0.1 10*3/uL (ref 0.0–0.7)
Eosinophils Relative: 2.5 % (ref 0.0–5.0)
HCT: 41.1 % (ref 36.0–46.0)
Hemoglobin: 13.8 g/dL (ref 12.0–15.0)
Lymphocytes Relative: 18.9 % (ref 12.0–46.0)
Lymphs Abs: 1.1 10*3/uL (ref 0.7–4.0)
MCHC: 33.7 g/dL (ref 30.0–36.0)
MCV: 88.4 fl (ref 78.0–100.0)
Monocytes Absolute: 0.4 10*3/uL (ref 0.1–1.0)
Monocytes Relative: 6.6 % (ref 3.0–12.0)
Neutro Abs: 4 10*3/uL (ref 1.4–7.7)
Neutrophils Relative %: 71.5 % (ref 43.0–77.0)
Platelets: 274 10*3/uL (ref 150.0–400.0)
RBC: 4.64 Mil/uL (ref 3.87–5.11)
RDW: 12.6 % (ref 11.5–15.5)
WBC: 5.6 10*3/uL (ref 4.0–10.5)

## 2021-10-07 LAB — BASIC METABOLIC PANEL
BUN: 24 mg/dL — ABNORMAL HIGH (ref 6–23)
CO2: 28 mEq/L (ref 19–32)
Calcium: 9.9 mg/dL (ref 8.4–10.5)
Chloride: 104 mEq/L (ref 96–112)
Creatinine, Ser: 0.89 mg/dL (ref 0.40–1.20)
GFR: 66.57 mL/min (ref >60.00)
Glucose, Bld: 88 mg/dL (ref 70–99)
Potassium: 4.5 mEq/L (ref 3.5–5.1)
Sodium: 140 mEq/L (ref 135–145)

## 2021-10-07 LAB — HEPATIC FUNCTION PANEL
ALT: 12 U/L (ref 0–35)
AST: 13 U/L (ref 0–37)
Albumin: 4.1 g/dL (ref 3.5–5.2)
Alkaline Phosphatase: 64 U/L (ref 39–117)
Bilirubin, Direct: 0 mg/dL (ref 0.0–0.3)
Total Bilirubin: 0.5 mg/dL (ref 0.2–1.2)
Total Protein: 6.8 g/dL (ref 6.0–8.3)

## 2021-10-07 LAB — LIPID PANEL
Cholesterol: 220 mg/dL — ABNORMAL HIGH (ref 0–200)
HDL: 70.8 mg/dL (ref >39.00)
LDL Cholesterol: 115 mg/dL — ABNORMAL HIGH (ref 0–99)
NonHDL: 149.43
Total CHOL/HDL Ratio: 3
Triglycerides: 173 mg/dL — ABNORMAL HIGH (ref 0.0–149.0)
VLDL: 34.6 mg/dL (ref 0.0–40.0)

## 2021-10-07 LAB — TSH: TSH: 0.35 u[IU]/mL (ref 0.35–5.50)

## 2021-10-07 MED ORDER — TRAZODONE HCL 50 MG PO TABS: 25.0000 mg | ORAL_TABLET | Freq: Every evening | ORAL | 3 refills | Status: DC | PRN

## 2021-10-07 MED ORDER — LEVOTHYROXINE SODIUM 50 MCG PO TABS: 50.0000 ug | ORAL_TABLET | Freq: Every day | ORAL | 3 refills | Status: DC

## 2021-10-07 NOTE — Assessment & Plan Note (Signed)
New to provider, ongoing for pt. She reports that she is constantly needing to use the bathroom, despite poor water intake.  She has hx of hysterectomy and bladder suspension- which I suspect is contributing to her OAB.  This is impacting her quality of life and ability to socialize and sleep.  Referral to Urology placed.  Pt expressed understanding and is in agreement w/ plan.

## 2021-10-07 NOTE — Patient Instructions (Signed)
Schedule your complete physial in 6 months Go to [address] [employer] producer) and get your labs done [facility] notify you of your lab results and make any changes if needed We'll call you with your Urology referral START the Trazodone nightly- start w/ 1/2 tab Continue to work on healthy diet and regular exercise- you can do it! Call with any questions or concerns Stay Safe! Stay Healthy! Welcome!  We're glad to have you! Happy Holidays!!!

## 2021-10-07 NOTE — Assessment & Plan Note (Signed)
Pt reports that she was told her lipids 'doubled' from December to June.  Upon review, this is not the case and they are nearly identical.  LDL is higher than goal, but pt reports she has been doing [employer] and trying to eat better.  Repeat labs today and determine if statin is needed.

## 2021-10-07 NOTE — Assessment & Plan Note (Signed)
New to provider, ongoing for pt.  She states she has been on multiple medications but they only work for a few days- if at all.  When asked whether she had been on Trazodone, she didn't think so.  Will start low dose Trazodone and monitor for improvement.  If no results, may move to Belsomra.  Pt expressed understanding and is in agreement w/ plan.

## 2021-10-07 NOTE — Assessment & Plan Note (Signed)
New.  These have returned recently after years of being asymptomatic.  Her last TSH was just below normal- which may be contributing to her hot flashes.  Check TSH level and if again low, will decrease medication and see if sxs improve.  Pt expressed understanding and is in agreement w/ plan.

## 2021-10-07 NOTE — Assessment & Plan Note (Signed)
New to provider, ongoing for pt.  + hot flashes, constipation, dry skin.  Last TSH was just below normal.  Will check today to determine if meds need to be adjusted.

## 2021-10-07 NOTE — Progress Notes (Signed)
   Subjective:    Patient ID: Anne Alexander, female    DOB: 07/12/1951, 70 y.o.   MRN: [id number]  HPI New to establish.  Previous MD- Tisovec  Hypothyroid- currently on Levothyroxine daily.  + constipation, hot flashes.  Hair is thinning- ongoing for several years.  Urinary frequency- pt reports she will leave her bathroom and walk to the kitchen and have to pee again.  She has dramatically restricted her water intake b/c of the frequency.  Feels bladder doesn't empty completely.  Has had pelvic surgery.    Hot flashes- pt reports this has been ongoing for months.  Has a GYN but has not discussed this w/ her.  Had flashes at time of hysterectomy but those had resolved and these are now back.  Previously on Premarin- 'yrs ago'.  No palpitations.  Insomnia- pt typically only sleeps 2-3 hrs/night.  Has had OSA workup and that was negative.  Pt reports racing thoughts.  'she has been on a million medicines'.  No problem falling asleep but difficulty staying asleep.    Hyperlipidemia- pt reports lipids were just recently elevated, she was told cholesterol had doubled.  Upon lab review it had not changed.  She was prescribed Crestor but did not start it.  No CP, SOB, abd pain, N/V.  Health Maintenance- UTD on mammo, pap, colonoscopy, immunizations.  Review of Systems For ROS see HPI   This visit occurred during the SARS-CoV-2 public health emergency.  Safety protocols were in place, including screening questions prior to the visit, additional usage of staff PPE, and extensive cleaning of exam room while observing appropriate contact time as indicated for disinfecting solutions.      Objective:   Physical Exam Vitals reviewed.  Constitutional:      General: She is not in acute distress.    Appearance: Normal appearance. She is well-developed. She is not ill-appearing.  HENT:     Head: Normocephalic and atraumatic.  Eyes:     Conjunctiva/sclera: Conjunctivae normal.     Pupils:  Pupils are equal, round, and reactive to light.  Neck:     Thyroid: No thyromegaly.  Cardiovascular:     Rate and Rhythm: Normal rate and regular rhythm.     Pulses: Normal pulses.     Heart sounds: Normal heart sounds. No murmur heard. Pulmonary:     Effort: Pulmonary effort is normal. No respiratory distress.     Breath sounds: Normal breath sounds.  Abdominal:     General: There is no distension.     Palpations: Abdomen is soft.     Tenderness: There is no abdominal tenderness.  Musculoskeletal:     Cervical back: Normal range of motion and neck supple.     Right lower leg: No edema.     Left lower leg: No edema.  Lymphadenopathy:     Cervical: No cervical adenopathy.  Skin:    General: Skin is warm and dry.  Neurological:     Mental Status: She is alert and oriented to person, place, and time.  Psychiatric:        Behavior: Behavior normal.          Assessment & Plan:

## 2021-10-10 ENCOUNTER — Telehealth: Payer: Self-pay | Admitting: Family Medicine

## 2021-10-10 NOTE — Telephone Encounter (Signed)
Left vm for patient informing her of what Dr Beverely Low advised

## 2021-10-10 NOTE — Telephone Encounter (Signed)
I would take a daily OTC Claritin or Zyrtec to improve her daytime symptoms and minimize drowsiness

## 2021-10-10 NOTE — Telephone Encounter (Signed)
Patient would like to know if there is an antihistamine that she can take during the day instead of the benadryl she was taking - she can not take the benadryl with the trazadone.  Please advise through my chart if there is something that she can take for the day time stuffiness.

## 2021-10-11 ENCOUNTER — Telehealth: Payer: Self-pay

## 2021-10-11 DIAGNOSIS — E039 Hypothyroidism, unspecified: Secondary | ICD-10-CM

## 2021-10-11 NOTE — Telephone Encounter (Signed)
-----   Message from Sheliah Hatch, MD sent at 10/07/2021  3:17 PM EST ----- Your cholesterol looks FANTASTIC!  Your TSH is again at the very low end of normal so I think we should decrease your dose to daily and repeat your TSH 1 month after starting the new dose.

## 2021-10-11 NOTE — Telephone Encounter (Signed)
Patient is aware of labs, she stated she would go to [place] location for the recheck TSH

## 2021-10-29 ENCOUNTER — Other Ambulatory Visit: Payer: Self-pay | Admitting: Family Medicine

## 2021-11-02 ENCOUNTER — Ambulatory Visit
Admission: RE | Admit: 2021-11-02 | Discharge: 2021-11-02 | Disposition: A | Discharge status: Home / Self Care | Payer: 59 | Source: Ambulatory Visit | Attending: Family Medicine | Admitting: Family Medicine

## 2021-11-02 DIAGNOSIS — Z1231 Encounter for screening mammogram for malignant neoplasm of breast: Secondary | ICD-10-CM

## 2021-11-08 ENCOUNTER — Other Ambulatory Visit (INDEPENDENT_AMBULATORY_CARE_PROVIDER_SITE_OTHER): Payer: 59

## 2021-11-08 DIAGNOSIS — E039 Hypothyroidism, unspecified: Secondary | ICD-10-CM | POA: Diagnosis not present

## 2021-11-08 LAB — TSH: TSH: 0.64 u[IU]/mL (ref 0.35–5.50)

## 2021-11-09 ENCOUNTER — Telehealth: Payer: Self-pay

## 2021-11-09 NOTE — Telephone Encounter (Signed)
-----   Message from Sheliah Hatch, MD sent at 11/09/2021  7:26 AM EST ----- TSH is normal.  No changes at this time

## 2021-11-09 NOTE — Telephone Encounter (Signed)
LVM to return call about labs

## 2021-12-12 ENCOUNTER — Telehealth: Payer: Self-pay | Admitting: *Deleted

## 2021-12-12 DIAGNOSIS — E039 Hypothyroidism, unspecified: Secondary | ICD-10-CM

## 2021-12-12 DIAGNOSIS — G4700 Insomnia, unspecified: Secondary | ICD-10-CM

## 2021-12-12 MED ORDER — LEVOTHYROXINE SODIUM 50 MCG PO TABS: 50.0000 ug | ORAL_TABLET | Freq: Every day | ORAL | 3 refills | Status: DC

## 2021-12-12 MED ORDER — TRAZODONE HCL 50 MG PO TABS: ORAL_TABLET | ORAL | 2 refills | Status: DC

## 2021-12-12 NOTE — Telephone Encounter (Signed)
Encourage patient to contact the pharmacy for refills or they can request refills through [facility]  (Please schedule appointment if patient has not been seen in over a year)    WHAT PHARMACY WOULD THEY LIKE THIS SENT TO: [facility]  MEDICATION NAME & DOSE: Trazadone and levothyroxine  NOTES/COMMENTS FROM PATIENT:  [facility] and [facility] are [facility]. Her insurance needs rx's sent to [facility]. She said she was able to get [facility] but needs prescriptions and refills sent to [facility] from now on. I advised her [facility] and she would need to add it back in if needed. Patient verbalized agreement.   Rx's rerouted to correct pharmacy. [facility]   Front office please notify patient: It takes 48-72 hours to process rx refill requests Ask patient to call pharmacy to ensure rx is ready before heading there.

## 2021-12-19 ENCOUNTER — Telehealth: Payer: Self-pay

## 2021-12-19 NOTE — Telephone Encounter (Signed)
Caller name:Ainslee Vespa   On DPR? :Yes  Call back number:[phone]  Provider they see: Birdie Riddle   Reason for call:Pt is having trouble taking the traZODone (DESYREL) 50 MG tablet  and she no longer wants to take can she stop taking this medication?

## 2021-12-20 NOTE — Telephone Encounter (Signed)
Patient is aware 

## 2021-12-20 NOTE — Telephone Encounter (Signed)
Ok to stop medication

## 2022-02-05 ENCOUNTER — Other Ambulatory Visit: Payer: Self-pay | Admitting: Family Medicine

## 2022-02-05 DIAGNOSIS — E039 Hypothyroidism, unspecified: Secondary | ICD-10-CM

## 2022-02-14 ENCOUNTER — Ambulatory Visit: Payer: 59 | Admitting: Family Medicine

## 2022-02-14 VITALS — BP 128/72 | HR 64 | Temp 97.4°F | Ht 65.0 in | Wt 182.8 lb

## 2022-02-14 DIAGNOSIS — N3000 Acute cystitis without hematuria: Secondary | ICD-10-CM

## 2022-02-14 LAB — POCT URINALYSIS DIPSTICK
Bilirubin, UA: NEGATIVE
Blood, UA: NEGATIVE
Glucose, UA: NEGATIVE
Ketones, UA: NEGATIVE
Nitrite, UA: NEGATIVE
Protein, UA: NEGATIVE
Spec Grav, UA: 1.01 (ref 1.010–1.025)
Urobilinogen, UA: 0.2 E.U./dL
pH, UA: 7 (ref 5.0–8.0)

## 2022-02-14 MED ORDER — PHENAZOPYRIDINE HCL 100 MG PO TABS: 100.0000 mg | ORAL_TABLET | Freq: Three times a day (TID) | ORAL | 0 refills | Status: DC | PRN

## 2022-02-14 MED ORDER — SULFAMETHOXAZOLE-TRIMETHOPRIM 800-160 MG PO TABS: 1.0000 | ORAL_TABLET | Freq: Two times a day (BID) | ORAL | 0 refills | Status: DC

## 2022-02-14 NOTE — Progress Notes (Signed)
?[facility] ?[facility] ?[address] ?[place] [id number] ?Dept: [phone] ?Dept Fax: [phone] ? ?Office Visit ? ?Subjective:  ? ? Patient ID: Anne Alexander, female    DOB: 24-Aug-1951, 71 y.o..   MRN: [id number] ? ?Chief Complaint  ?Patient presents with  ? Acute Visit  ?  C/o having urine frequency and dysuria x 4 days.    ? ? ?History of Present Illness: ? ?Patient is in today complaining of a 3-4 day history of urinary frequency and significant dysuria. She notes this was esp. bad last night. She has been pushing fluids today and has had some decrease in the dysuria. She notes she has never had a bladder infection that she knows of. She has had a prior hysterectomy and bladder suspension with placement of mesh. She had discussed urinary frequency previously with Dr. Beverely Lowabori (her PCP) who referred her to urology. She has not been able to be seen yet for this. ? ?Past Medical History: ?Patient Active Problem List  ? Diagnosis Date Noted  ? Hypothyroid 10/07/2021  ? Hot flashes 10/07/2021  ? Insomnia 10/07/2021  ? Hyperlipidemia 10/07/2021  ? Urinary frequency 10/07/2021  ? Memory change   ? ?Past Surgical History:  ?Procedure Laterality Date  ? ANTERIOR AND POSTERIOR REPAIR WITH SACROSPINOUS FIXATION N/A 01/03/2021  ? Procedure: SACROSPINOUS LIGAMENT FIXATION;  Surgeon: Marguerita BeardsSchroeder, Michelle N, MD;  [facility];  Service: Gynecology;  Laterality: N/A;  total time needed for all 4 procedures is 2 hours  ? BLADDER SUSPENSION  yrs ago  ? Raz bladder suspension  ? BLADDER SUSPENSION N/A 01/03/2021  ? Procedure: TRANSVAGINAL TAPE (TVT) PROCEDURE;  Surgeon: Marguerita BeardsSchroeder, Michelle N, MD;  [facility];  Service: Gynecology;  Laterality: N/A;  ? colonscopy  2021  ? CYSTOSCOPY N/A 01/03/2021  ? Procedure: CYSTOSCOPY;  Surgeon: Marguerita BeardsSchroeder, Michelle N, MD;  [facility] [place];  Service: Gynecology;  Laterality:  N/A;  ? ingrown toenail removed  yrs ago  ? NECK SURGERY  6-7 yrs ago  ? cervical has plates and screws dr Jule Sernudleman  ? OOPHORECTOMY Bilateral age 71  ? endometriosis  ? RECTOCELE REPAIR  01/03/2021  ? Procedure: POSTERIOR REPAIR (RECTOCELE);  Surgeon: Marguerita BeardsSchroeder, Michelle N, MD;  [facility] ;  Service: Gynecology;;  ? TONSILLECTOMY  as child  ? VAGINAL HYSTERECTOMY  age 71  ? partial then complete  ? ?Family History  ?Problem Relation Age of Onset  ? Dementia Mother   ? Breast cancer Mother [id number]  ? Heart Problems Sister   ? Dementia Brother   ? Alzheimer's disease Maternal Aunt   ? Alzheimer's disease Maternal Grandfather   ? ?Outpatient Medications Prior to Visit  ?Medication Sig Dispense Refill  ? acetaminophen (TYLENOL) 325 MG tablet Take 650 mg by mouth every 6 (six) hours as needed.    ? diphenhydrAMINE HCl (BENADRYL PO) Take 25 mg by mouth at bedtime as needed.    ? doxycycline (MONODOX) 100 MG capsule Take 100 mg by mouth 2 (two) times daily.    ? ketotifen (ZADITOR) 0.025 % ophthalmic solution 1 drop 2 (two) times daily.    ? levothyroxine (SYNTHROID) 50 MCG tablet TAKE 1 TABLET BY MOUTH DAILY 90 tablet 3  ? OVER THE COUNTER MEDICATION Viactiv calcium and vit d and vit k 2 tabs daily    ? cyclobenzaprine (FLEXERIL) 10 MG tablet Take 10 mg by mouth 3 (three) times daily as needed  for muscle spasms. (Patient not taking: Reported on 02/14/2022)    ? diclofenac (VOLTAREN) 50 MG EC tablet Take 50 mg by mouth 2 (two) times daily as needed. (Patient not taking: Reported on 02/14/2022)  3  ? doxylamine, Sleep, (UNISOM) 25 MG tablet Take 25 mg by mouth at bedtime as needed. (Patient not taking: Reported on 02/14/2022)    ? traZODone (DESYREL) 50 MG tablet TAKE 0.5-1 TABLETS BY MOUTH AT BEDTIME AS NEEDED FOR SLEEP. (Patient not taking: Reported on 02/14/2022) 90 tablet 2  ? ?No facility-administered medications prior to visit.  ? ?Allergies  ?Allergen Reactions  ? Penicillins Rash  ?   ?Objective:   ? ?Today's Vitals  ? 02/14/22 1123  ?BP: 128/72  ?Pulse: 64  ?Temp: (!) 97.4 ?F (36.3 ?C)  ?TempSrc: Temporal  ?SpO2: 96%  ?Weight: 182 lb 12.8 oz (82.9 kg)  ?Height: 5\' 5"  (1.651 m)  ? ?Body mass index is 30.42 kg/m?.  ? ?General: Well developed, well nourished. No acute distress. ?Psych: Alert and oriented. Normal mood and affect. ? ?Health Maintenance Due  ?Topic Date Due  ? Hepatitis C Screening  Never done  ? TETANUS/TDAP  Never done  ? Pneumonia Vaccine 52+ Years old (1 - PCV) Never done  ? Zoster Vaccines- Shingrix (2 of 2) 12/09/2020  ? ?Lab Results ?Urine dipstick shows negative for nitrites, red blood cells, glucose, protein, ketones, urobilinogen, positive for leukocytes. ? ?Assessment & Plan:  ? ?1. Acute cystitis without hematuria ?I will treat MS. Orsino empirically with Septra in light of her symptoms and her positive leukocyte esterase. I will send the urine for culture. I will add Pyridium for comfort. She should follow-up with Dr. Birdie Riddle if her symptoms are persistent. ? ?- POCT Urinalysis Dipstick ?- sulfamethoxazole-trimethoprim (BACTRIM DS) 800-160 MG tablet; Take 1 tablet by mouth 2 (two) times daily.  Dispense: 6 tablet; Refill: 0 ?- phenazopyridine (PYRIDIUM) 100 MG tablet; Take 1 tablet (100 mg total) by mouth 3 (three) times daily as needed for pain.  Dispense: 6 tablet; Refill: 0 ?- Urine Culture ? ?Return if symptoms worsen or fail to improve.  ? ?Haydee Salter, MD ?

## 2022-02-15 LAB — URINE CULTURE
MICRO NUMBER:: 13248002
Result:: NO GROWTH
SPECIMEN QUALITY:: ADEQUATE

## 2022-04-07 ENCOUNTER — Encounter: Payer: Self-pay | Admitting: Family Medicine

## 2022-04-07 ENCOUNTER — Ambulatory Visit (INDEPENDENT_AMBULATORY_CARE_PROVIDER_SITE_OTHER): Payer: 59 | Admitting: Family Medicine

## 2022-04-07 ENCOUNTER — Other Ambulatory Visit: Payer: Self-pay

## 2022-04-07 VITALS — BP 122/68 | HR 65 | Temp 97.6°F | Resp 18 | Ht 65.5 in | Wt 183.6 lb

## 2022-04-07 DIAGNOSIS — E785 Hyperlipidemia, unspecified: Secondary | ICD-10-CM

## 2022-04-07 DIAGNOSIS — Z1159 Encounter for screening for other viral diseases: Secondary | ICD-10-CM | POA: Diagnosis not present

## 2022-04-07 DIAGNOSIS — Z0000 Encounter for general adult medical examination without abnormal findings: Secondary | ICD-10-CM | POA: Diagnosis not present

## 2022-04-07 DIAGNOSIS — N644 Mastodynia: Secondary | ICD-10-CM | POA: Diagnosis not present

## 2022-04-07 LAB — CBC WITH DIFFERENTIAL/PLATELET
Basophils Absolute: 0 10*3/uL (ref 0.0–0.1)
Basophils Relative: 0.5 % (ref 0.0–3.0)
Eosinophils Absolute: 0.2 10*3/uL (ref 0.0–0.7)
Eosinophils Relative: 2.7 % (ref 0.0–5.0)
HCT: 40.1 % (ref 36.0–46.0)
Hemoglobin: 13.7 g/dL (ref 12.0–15.0)
Lymphocytes Relative: 21.9 % (ref 12.0–46.0)
Lymphs Abs: 1.2 10*3/uL (ref 0.7–4.0)
MCHC: 34.1 g/dL (ref 30.0–36.0)
MCV: 88.4 fl (ref 78.0–100.0)
Monocytes Absolute: 0.4 10*3/uL (ref 0.1–1.0)
Monocytes Relative: 7.5 % (ref 3.0–12.0)
Neutro Abs: 3.8 10*3/uL (ref 1.4–7.7)
Neutrophils Relative %: 67.4 % (ref 43.0–77.0)
Platelets: 255 10*3/uL (ref 150.0–400.0)
RBC: 4.54 Mil/uL (ref 3.87–5.11)
RDW: 12.9 % (ref 11.5–15.5)
WBC: 5.7 10*3/uL (ref 4.0–10.5)

## 2022-04-07 LAB — LIPID PANEL
Cholesterol: 225 mg/dL — ABNORMAL HIGH (ref 0–200)
HDL: 63 mg/dL (ref >39.00)
LDL Cholesterol: 124 mg/dL — ABNORMAL HIGH (ref 0–99)
NonHDL: 162.13
Total CHOL/HDL Ratio: 4
Triglycerides: 193 mg/dL — ABNORMAL HIGH (ref 0.0–149.0)
VLDL: 38.6 mg/dL (ref 0.0–40.0)

## 2022-04-07 LAB — HEPATIC FUNCTION PANEL
ALT: 14 U/L (ref 0–35)
AST: 16 U/L (ref 0–37)
Albumin: 4.2 g/dL (ref 3.5–5.2)
Alkaline Phosphatase: 62 U/L (ref 39–117)
Bilirubin, Direct: 0.1 mg/dL (ref 0.0–0.3)
Total Bilirubin: 0.4 mg/dL (ref 0.2–1.2)
Total Protein: 7.1 g/dL (ref 6.0–8.3)

## 2022-04-07 LAB — BASIC METABOLIC PANEL
BUN: 25 mg/dL — ABNORMAL HIGH (ref 6–23)
CO2: 29 mEq/L (ref 19–32)
Calcium: 9.7 mg/dL (ref 8.4–10.5)
Chloride: 105 mEq/L (ref 96–112)
Creatinine, Ser: 0.9 mg/dL (ref 0.40–1.20)
GFR: 65.45 mL/min (ref >60.00)
Glucose, Bld: 74 mg/dL (ref 70–99)
Potassium: 4.2 mEq/L (ref 3.5–5.1)
Sodium: 141 mEq/L (ref 135–145)

## 2022-04-07 LAB — TSH: TSH: 0.53 u[IU]/mL (ref 0.35–5.50)

## 2022-04-07 NOTE — Progress Notes (Signed)
   Subjective:    Patient ID: Anne Alexander, female    DOB: 23-Sep-1951, 71 y.o.   MRN: [id number]  HPI CPE- UTD on mammo, colonoscopy.  UTD on PNA vaccines.  Due for 2nd Shingrix shot  Health Maintenance  Topic Date Due   Hepatitis C Screening  Never done   Zoster Vaccines- Shingrix (2 of 2) 07/08/2022 (Originally 12/09/2020)   Pneumonia Vaccine 30+ Years old (1 - PCV) 04/08/2023 (Originally 03/31/2018)   TETANUS/TDAP  04/08/2023 (Originally 03/31/1972)   INFLUENZA VACCINE  06/06/2022   MAMMOGRAM  11/03/2023   COLONOSCOPY (Pts 45-40yrs Insurance coverage will need to be confirmed)  06/22/2030   DEXA SCAN  Completed   COVID-19 Vaccine  Completed   HPV VACCINES  Aged Out      Review of Systems Patient reports no vision/ hearing changes, adenopathy,fever, weight change,  persistant/recurrent hoarseness, chest pain, palpitations, edema, persistant/recurrent cough, hemoptysis, dyspnea (rest/exertional/paroxysmal nocturnal), gastrointestinal bleeding (melena, rectal bleeding), abdominal pain, significant heartburn, bowel changes, GU symptoms (dysuria, hematuria, incontinence), Gyn symptoms (abnormal  bleeding, pain),  syncope, focal weakness, memory loss, skin/hair/nail changes, abnormal bruising or bleeding, anxiety, or depression.   + dysphagia w/ solid foods- not interested in GI referral. + breast pain- pt reports progressively worsening breast pain.  Mom w/ breast cancer, sister w/ BRCA1 + numbness/tingling of both feet    Objective:   Physical Exam General Appearance:    Alert, cooperative, no distress, appears stated age  Head:    Normocephalic, without obvious abnormality, atraumatic  Eyes:    PERRL, conjunctiva/corneas clear, EOM's intact both eyes  Ears:    Normal TM's and external ear canals, both ears  Nose:   Nares normal, septum midline, mucosa normal, no drainage    or sinus tenderness  Throat:   Lips, mucosa, and tongue normal; teeth and gums normal  Neck:   Supple,  symmetrical, trachea midline, no adenopathy;    Thyroid: no enlargement/tenderness/nodules  Back:     Symmetric, no curvature, ROM normal, no CVA tenderness  Lungs:     Clear to auscultation bilaterally, respirations unlabored  Chest Wall:    No tenderness or deformity   Heart:    Regular rate and rhythm, S1 and S2 normal, no murmur, rub   or gallop  Breast Exam:    Deferred to mammo  Abdomen:     Soft, non-tender, bowel sounds active all four quadrants,    no masses, no organomegaly  Genitalia:    Deferred  Rectal:    Extremities:   Extremities normal, atraumatic, no cyanosis or edema  Pulses:   2+ and symmetric all extremities  Skin:   Skin color, texture, turgor normal, no rashes or lesions  Lymph nodes:   Cervical, supraclavicular, and axillary nodes normal  Neurologic:   CNII-XII intact, normal strength, sensation and reflexes    throughout          Assessment & Plan:

## 2022-04-07 NOTE — Patient Instructions (Addendum)
Follow up in 1 year or as needed We'll notify you of your lab results and make any changes if needed Continue to work on healthy diet and regular exercise- you can do it! We'll call you to schedule your mammogram Call with any questions or concerns Stay Safe!  Stay Healthy! Have a great summer!!!

## 2022-04-09 NOTE — Assessment & Plan Note (Signed)
Last LDL 115.  Attempting to control w/ diet and exercise.  Check labs and determine if medication is needed.

## 2022-04-09 NOTE — Assessment & Plan Note (Signed)
Pt's PE WNL w/ exception of obesity.  UTD on mammo but due to breast pain and family hx will get diagnostic mammo.  UTD on colonoscopy, PNA vaccines.  2nd Shingrix given.  Check labs.  Anticipatory guidance provided.

## 2022-04-10 ENCOUNTER — Telehealth: Payer: Self-pay

## 2022-04-10 LAB — HEPATITIS C ANTIBODY
Hepatitis C Ab: NONREACTIVE
SIGNAL TO CUT-OFF: 0.03 (ref <1.00)

## 2022-04-10 NOTE — Telephone Encounter (Signed)
-----   Message from Sheliah Hatch, MD sent at 04/09/2022 12:01 PM EDT ----- Labs look good!  No changes at this time

## 2022-04-10 NOTE — Telephone Encounter (Signed)
Spoke w/ pt and advised of lab results  

## 2022-05-10 ENCOUNTER — Other Ambulatory Visit: Payer: Self-pay | Admitting: Family Medicine

## 2022-05-10 DIAGNOSIS — N644 Mastodynia: Secondary | ICD-10-CM

## 2022-05-25 ENCOUNTER — Other Ambulatory Visit (HOSPITAL_COMMUNITY): Payer: Self-pay

## 2022-05-25 MED ORDER — PREDNISOLON-MOXIFLOX-BROMFENAC 1-0.5-0.075 % OP SOLN
1.0000 [drp] | Freq: Four times a day (QID) | OPHTHALMIC | 1 refills | Status: DC
  Filled 2022-05-25: qty 5, 25d supply, fill #0

## 2022-05-31 ENCOUNTER — Ambulatory Visit: Payer: 59

## 2022-05-31 ENCOUNTER — Ambulatory Visit
Admission: RE | Admit: 2022-05-31 | Discharge: 2022-05-31 | Disposition: A | Discharge status: Home / Self Care | Payer: 59 | Source: Ambulatory Visit | Attending: Family Medicine | Admitting: Family Medicine

## 2022-05-31 DIAGNOSIS — N644 Mastodynia: Secondary | ICD-10-CM

## 2022-06-01 NOTE — Progress Notes (Signed)
FYI - I think I had ended up signing this at some point  Thanks,  Luan Pulling

## 2022-06-26 ENCOUNTER — Encounter (HOSPITAL_COMMUNITY): Payer: Self-pay

## 2022-06-26 ENCOUNTER — Emergency Department (HOSPITAL_COMMUNITY)
Admission: EM | Admit: 2022-06-26 | Discharge: 2022-06-26 | Disposition: A | Discharge status: Home / Self Care | Payer: 59 | Attending: Emergency Medicine | Admitting: Emergency Medicine

## 2022-06-26 ENCOUNTER — Other Ambulatory Visit: Payer: Self-pay

## 2022-06-26 DIAGNOSIS — K922 Gastrointestinal hemorrhage, unspecified: Secondary | ICD-10-CM | POA: Insufficient documentation

## 2022-06-26 DIAGNOSIS — K644 Residual hemorrhoidal skin tags: Secondary | ICD-10-CM | POA: Insufficient documentation

## 2022-06-26 DIAGNOSIS — K625 Hemorrhage of anus and rectum: Secondary | ICD-10-CM | POA: Diagnosis present

## 2022-06-26 LAB — URINALYSIS, ROUTINE W REFLEX MICROSCOPIC
Bacteria, UA: NONE SEEN
Bilirubin Urine: NEGATIVE
Glucose, UA: NEGATIVE mg/dL
Ketones, ur: NEGATIVE mg/dL
Nitrite: NEGATIVE
Protein, ur: NEGATIVE mg/dL
Specific Gravity, Urine: 1.011 (ref 1.005–1.030)
pH: 5 (ref 5.0–8.0)

## 2022-06-26 LAB — COMPREHENSIVE METABOLIC PANEL
ALT: 17 U/L (ref 0–44)
AST: 19 U/L (ref 15–41)
Albumin: 3.8 g/dL (ref 3.5–5.0)
Alkaline Phosphatase: 57 U/L (ref 38–126)
Anion gap: 7 (ref 5–15)
BUN: 27 mg/dL — ABNORMAL HIGH (ref 8–23)
CO2: 24 mmol/L (ref 22–32)
Calcium: 9.5 mg/dL (ref 8.9–10.3)
Chloride: 110 mmol/L (ref 98–111)
Creatinine, Ser: 0.92 mg/dL (ref 0.44–1.00)
GFR, Estimated: >60 mL/min (ref >60)
Glucose, Bld: 100 mg/dL — ABNORMAL HIGH (ref 70–99)
Potassium: 4.3 mmol/L (ref 3.5–5.1)
Sodium: 141 mmol/L (ref 135–145)
Total Bilirubin: 0.6 mg/dL (ref 0.3–1.2)
Total Protein: 6.4 g/dL — ABNORMAL LOW (ref 6.5–8.1)

## 2022-06-26 LAB — CBC WITH DIFFERENTIAL/PLATELET
Abs Immature Granulocytes: 0.01 10*3/uL (ref 0.00–0.07)
Basophils Absolute: 0 10*3/uL (ref 0.0–0.1)
Basophils Relative: 0 %
Eosinophils Absolute: 0.2 10*3/uL (ref 0.0–0.5)
Eosinophils Relative: 3 %
HCT: 40 % (ref 36.0–46.0)
Hemoglobin: 13 g/dL (ref 12.0–15.0)
Immature Granulocytes: 0 %
Lymphocytes Relative: 22 %
Lymphs Abs: 1.3 10*3/uL (ref 0.7–4.0)
MCH: 29.8 pg (ref 26.0–34.0)
MCHC: 32.5 g/dL (ref 30.0–36.0)
MCV: 91.7 fL (ref 80.0–100.0)
Monocytes Absolute: 0.4 10*3/uL (ref 0.1–1.0)
Monocytes Relative: 7 %
Neutro Abs: 4 10*3/uL (ref 1.7–7.7)
Neutrophils Relative %: 68 %
Platelets: 262 10*3/uL (ref 150–400)
RBC: 4.36 MIL/uL (ref 3.87–5.11)
RDW: 12.3 % (ref 11.5–15.5)
WBC: 5.9 10*3/uL (ref 4.0–10.5)
nRBC: 0 % (ref 0.0–0.2)

## 2022-06-26 LAB — TYPE AND SCREEN
ABO/RH(D): O POS
Antibody Screen: NEGATIVE

## 2022-06-26 LAB — LIPASE, BLOOD: Lipase: 36 U/L (ref 11–51)

## 2022-06-26 NOTE — Discharge Instructions (Signed)
Follow-up with your established gastroenterologist first thing tomorrow morning for an appointment for lower GI bleed.  Do not take Advil or Motrin at this time help decrease bleeding and risk of gastric ulcers.  Please call your ophthalmologist for your scheduled cataract surgery for next week.  Explained that you are currently having rectal bleeding but have a very stable hemoglobin of 13.  States that you are waiting to get in with your GI specialist for considerations for colonoscopy.  Let them decide whether or not to continue with your procedure.  These return to emergency department immediately for any worsening symptoms including lightheadedness, dizziness, feelings that you are in a pass out, or fatigue reevaluation of your hemoglobin level.

## 2022-06-26 NOTE — ED Provider Triage Note (Signed)
Emergency Medicine Provider Triage Evaluation Note  Anne Alexander , a 71 y.o. female  was evaluated in triage.  Pt complains of bright red blood per rectum. She reports that she went to pass gas at work and instead pass bright red blood. She reports that she was passing blood clots as well. Denies any pain is defecation. Denies any abdominal pain, nausea, vomiting, or fever.  Review of Systems  Positive:  Negative:   Physical Exam  BP (!) 153/79 (BP Location: Left Arm)   Pulse 63   Temp 98.8 F (37.1 C) (Oral)   Resp 18   SpO2 97%  Gen:   Awake, no distress   Resp:  Normal effort  MSK:   Moves extremities without difficulty  Other:    Medical Decision Making  Medically screening exam initiated at 2:27 PM.  Appropriate orders placed.  Anne Alexander was informed that the remainder of the evaluation will be completed by another provider, this initial triage assessment does not replace that evaluation, and the importance of remaining in the ED until their evaluation is complete.  Will order labs. Will defer imaging until patient is evaluated in the back.    Achille Rich, PA-C 06/26/22 1429

## 2022-06-26 NOTE — ED Triage Notes (Signed)
Pt reports having dark stools over the past few days and reports today having an episode of bright red blood. Denies abdominal pain and N/V.

## 2022-06-26 NOTE — ED Provider Notes (Signed)
[facility]-EMERGENCY DEPT Provider Note   CSN: [id number] Arrival date & time: 06/26/22  1406     History  Chief Complaint  Patient presents with   Rectal Bleeding    Anne Alexander is a 71 y.o. female.  Patients is a 71 year old female presenting for complaints of blood per rectum.  Patient admits to like she was going to pass gas and went to the bathroom and had bright red blood and clots per rectum.  States she had 3 of these episodes prior to arrival.  Denies any lightheadedness, dizziness, presyncopal symptoms, or fatigue.  Denies prior history of GI bleed.  Follows with Dr. Russella Dar with [facility].  Patient states she had multiple normal colonoscopies in the past. Takes Advil bid for several weeks for neck pain. Denies abdominal pain, nausea, or vomiting.   The history is provided by the patient. No language interpreter was used.  Rectal Bleeding Associated symptoms: no abdominal pain, no fever and no vomiting        Home Medications Prior to Admission medications   Medication Sig Start Date End Date Taking? Authorizing Provider  acetaminophen (TYLENOL) 325 MG tablet Take 650 mg by mouth every 6 (six) hours as needed.    [provider]  diphenhydrAMINE HCl (BENADRYL PO) Take 25 mg by mouth at bedtime as needed.    [provider]  levothyroxine (SYNTHROID) 50 MCG tablet TAKE 1 TABLET BY MOUTH DAILY 02/06/22   Sheliah Hatch, MD      Allergies    Penicillins    Review of Systems   Review of Systems  Constitutional:  Negative for chills and fever.  HENT:  Negative for ear pain and sore throat.   Eyes:  Negative for pain and visual disturbance.  Respiratory:  Negative for cough and shortness of breath.   Cardiovascular:  Negative for chest pain and palpitations.  Gastrointestinal:  Positive for hematochezia. Negative for abdominal pain and vomiting.       Blood per rectum   Genitourinary:  Negative for dysuria and  hematuria.  Musculoskeletal:  Negative for arthralgias and back pain.  Skin:  Negative for color change and rash.  Neurological:  Negative for seizures and syncope.  All other systems reviewed and are negative.   Physical Exam Updated Vital Signs BP (!) 170/75 (BP Location: Left Arm)   Pulse 60   Temp 98.8 F (37.1 C) (Oral)   Resp 16   SpO2 97%  Physical Exam Vitals and nursing note reviewed. Exam conducted with a chaperone present.  Constitutional:      General: She is not in acute distress.    Appearance: She is well-developed.  HENT:     Head: Normocephalic and atraumatic.  Eyes:     Conjunctiva/sclera: Conjunctivae normal.  Cardiovascular:     Rate and Rhythm: Normal rate and regular rhythm.     Heart sounds: No murmur heard. Pulmonary:     Effort: Pulmonary effort is normal. No respiratory distress.     Breath sounds: Normal breath sounds.  Abdominal:     Palpations: Abdomen is soft.     Tenderness: There is no abdominal tenderness.  Genitourinary:    Rectum: Guaiac result positive. External hemorrhoid present. No internal hemorrhoid.     Comments: Nonbleeding nonthrombosed external hemorrhoid.  No internal hemorrhoids palpated.  Digital rectal exam positive for gross blood mixed with soft brown stool. Musculoskeletal:        General: No swelling.  Cervical back: Neck supple.  Skin:    General: Skin is warm and dry.     Capillary Refill: Capillary refill takes less than 2 seconds.  Neurological:     Mental Status: She is alert.  Psychiatric:        Mood and Affect: Mood normal.     ED Results / Procedures / Treatments   Labs (all labs ordered are listed, but only abnormal results are displayed) Labs Reviewed  COMPREHENSIVE METABOLIC PANEL - Abnormal; Notable for the following components:      Result Value   Glucose, Bld 100 (*)    BUN 27 (*)    Total Protein 6.4 (*)    All other components within normal limits  URINALYSIS, ROUTINE W REFLEX  MICROSCOPIC - Abnormal; Notable for the following components:   Color, Urine STRAW (*)    Hgb urine dipstick MODERATE (*)    Leukocytes,Ua SMALL (*)    All other components within normal limits  CBC WITH DIFFERENTIAL/PLATELET  LIPASE, BLOOD  TYPE AND SCREEN  ABO/RH    EKG None  Radiology No results found.  Procedures Procedures    Medications Ordered in ED Medications - No data to display  ED Course/ Medical Decision Making/ A&P                           Medical Decision Making  55:54 PM 71 year old female presenting for complaints of blood per rectum.  Is alert and oriented x3, no acute distress, afebrile, stable vital signs.  No hypotension.  Globin 13.  Physical exam demonstrates Nonbleeding nonthrombosed external hemorrhoid.  No internal hemorrhoids palpated.  Digital rectal exam positive for gross blood mixed with soft brown stool.  Patient has establish care with [facility] with Dr. Russella Dar.  Mended to call first thing in the morning for appointment for likely lower GI bleed.  Patient recommended to closely monitor symptoms including prompt return to the emergency department for lightheadedness, dizziness, presyncopal symptoms, syncope, or fatigue.  Patient agreeable to plan.  Patient also recommended to stop using Advil at this time.  Patient in no distress and overall condition improved here in the ED. Detailed discussions were had with the patient regarding current findings, and need for close f/u with PCP or on call doctor. The patient has been instructed to return immediately if the symptoms worsen in any way for re-evaluation. Patient verbalized understanding and is in agreement with current care plan. All questions answered prior to discharge.         Final Clinical Impression(s) / ED Diagnoses Final diagnoses:  Lower GI bleed    Rx / DC Orders ED Discharge Orders     None         Franne Forts, DO 06/26/22 1731

## 2022-06-27 ENCOUNTER — Encounter: Payer: Self-pay | Admitting: Gastroenterology

## 2022-06-27 NOTE — Telephone Encounter (Signed)
Spoke with patient in regards to [facility] encounter. She was seen in the ED yesterday for rectal bleeding & was told to follow up with our office. She has not had as much bleeding as yesterday, however stools are still bloody. Denies pain, n/v. She has been scheduled for f/u with Amy, PA on 06/30/22 at 3:00 pm. Pt advised that if she develops severe abdominal pain, bleeding, unable to hold food/liquids down then she would need to be seen in ED prior to that appointment. Pt verbalized all understanding.

## 2022-06-30 ENCOUNTER — Encounter: Payer: Self-pay | Admitting: Physician Assistant

## 2022-06-30 ENCOUNTER — Ambulatory Visit: Payer: 59 | Admitting: Physician Assistant

## 2022-06-30 VITALS — BP 122/70 | HR 75 | Ht 65.5 in | Wt 191.0 lb

## 2022-06-30 DIAGNOSIS — K625 Hemorrhage of anus and rectum: Secondary | ICD-10-CM | POA: Diagnosis not present

## 2022-06-30 DIAGNOSIS — K5792 Diverticulitis of intestine, part unspecified, without perforation or abscess without bleeding: Secondary | ICD-10-CM

## 2022-06-30 NOTE — Progress Notes (Signed)
Subjective:    Patient ID: Anne Alexander, female    DOB: 1950-12-04, 71 y.o.   MRN: [id number]  HPI Anne Alexander is a pleasant 71 year old white female, established with Dr. Fuller Plan who comes in today for follow-up after recent ER visit. She presented to the emergency room on 06/26/2022 after acute onset of bright red blood per rectum.  She says this occurred at work, she had urge for a bowel movement and thought she might just pass gas, but then passed large amount of bright red blood and "pieces of clot".  She had no associated abdominal pain or cramping no diaphoresis no nausea or vomiting no dizziness or lightheadedness. Shortly thereafter she had a second episode and passed a similar amount of bright red blood and clot.  She went on to the emergency room and had a very small episode there.  No further active bleeding.  The following day she had a more normal-appearing reddish stool, and then bowel movements have appeared normal and brown since then. Work-up in the emergency room with CBC showing a WBC of 5.9/hemoglobin 13/hematocrit of 40. C-Met unremarkable, BUN 27/creatinine 0.92 She did not have abdominal imaging.  She had been taking 2 Advil twice daily and 1 Tylenol twice daily for the preceding couple of weeks for what she describes as back and neck pain.  She has since discontinued the Advil.  She has no complaints of abdominal discomfort, no nausea, appetite has been fine no heartburn or indigestion.  She is anticipating upcoming cataract surgery later next week.  Other medical problems include hyperlipidemia and hypothyroidism.  She did undergo colonoscopy in August 2021 for screening and was found to have scattered diverticulosis in the right and left colon, medium sized internal hemorrhoids and no polyps.  Review of Systems Pertinent positive and negative review of systems were noted in the above HPI section.  All other review of systems was otherwise negative.   Outpatient  Encounter Medications as of 06/30/2022  Medication Sig   Calcium-Vitamin D-Vitamin K (VIACTIV PO) Take by mouth. 2 chews a day   doxycycline (MONODOX) 100 MG capsule Take 100 mg by mouth daily.   levothyroxine (SYNTHROID) 50 MCG tablet TAKE 1 TABLET BY MOUTH DAILY   [DISCONTINUED] acetaminophen (TYLENOL) 325 MG tablet Take 650 mg by mouth every 6 (six) hours as needed.   [DISCONTINUED] diphenhydrAMINE HCl (BENADRYL PO) Take 25 mg by mouth at bedtime as needed.   No facility-administered encounter medications on file as of 06/30/2022.   Allergies  Allergen Reactions   Penicillins Rash   Patient Active Problem List   Diagnosis Date Noted   Physical exam 04/07/2022   Hypothyroid 10/07/2021   Hot flashes 10/07/2021   Insomnia 10/07/2021   Hyperlipidemia 10/07/2021   Urinary frequency 10/07/2021   Memory change    Social History   Socioeconomic History   Marital status: Married    Spouse name: Anne Alexander   Number of children: 1   Years of education: 13   Highest education level: Not on file  Occupational History    Employer: [employer]  Tobacco Use   Smoking status: Former    Packs/day: 1.00    Years: 20.00    Total pack years: 20.00    Types: Cigarettes    Quit date: 03/24/2006    Years since quitting: 16.2   Smokeless tobacco: Never  Vaping Use   Vaping Use: Never used  Substance and Sexual Activity   Alcohol use: No   Drug  use: No   Sexual activity: Not Currently    Comment: Pt. declined sexual hx questions  Other Topics Concern   Not on file  Social History Narrative   Patient lives at home with her husband and works full time at [employer].    Education one year of college.   Drinks about 2 cups of coffee a day, and 1 soda a day    Social Determinants of Radio broadcast assistant Strain: Not on file  Food Insecurity: Not on file  Transportation Needs: Not on file  Physical Activity: Not on file  Stress: Not on file  Social Connections: Not on file   Intimate Partner Violence: Not on file    [name]'s family history includes Alzheimer's disease in her maternal aunt and maternal grandfather; Breast cancer (age of onset: 61) in her mother; Dementia in her brother and mother; Heart Problems in her sister.      Objective:    Vitals:   06/30/22 1447  BP: 122/70  Pulse: 75    Physical Exam Well-developed well-nourished older white female in no acute distress.  Patient accompanied by her daughter height, Weight, 191 BMI 31.3  HEENT; nontraumatic normocephalic, EOMI, PE R LA, sclera anicteric. Oropharynx; not examined today Neck; supple, no JVD Cardiovascular; regular rate and rhythm with S1-S2, no murmur rub or gallop Pulmonary; Clear bilaterally Abdomen; soft, nontender, nondistended, no palpable mass or hepatosplenomegaly, bowel sounds are active Rectal; not done today Skin; benign exam, no jaundice rash or appreciable lesions Extremities; no clubbing cyanosis or edema skin warm and dry Neuro/Psych; alert and oriented x4, grossly nonfocal mood and affect appropriate        Assessment & Plan:   #55 71 year old white female with episode 4 days ago of painless hematochezia with passage of bright red blood and clots.  She had 2 episodes prior to presenting to the emergency room, and then 1 very small episode while in the emergency room.  No further active bleeding since that time, described a more normal-appearing but reddish tinted stool the following day and brown normal bowel movement since.  CBC was completely normal.  Patient has known right and left-sided diverticulosis from colonoscopy August 2021 and medium sized internal hemorrhoids.  She had been using higher dose NSAID with 2 Advil p.o. twice daily for a couple of weeks prior to this acute episode  I suspect she had a self-limited diverticular bleed aggravated by NSAID use.  #2 hyperlipidemia #3.  Hypothyroidism #4.  Cataracts  Plan; patient has discontinued  Advil.  We discussed continued avoidance of NSAIDs moving forward, Tylenol as needed should be fine. We had a discussion regarding diverticulosis and diverticulitis and diverticular bleeding. She uses MiraLAX on a as needed basis and will continue that. She is advised that should she have recurrent episode of significant rectal bleeding that evaluation in the emergency room is appropriate as diverticular bleeding can be significant and sometimes requires hospitalization. Hopefully she will not have any further episodes. She knows to call for problems, and will follow-up with Dr. Fuller Plan or myself on an as-needed basis. She would not be due for follow-up colonoscopy until 2031.   Caretha Rumbaugh Genia Harold PA-C 06/30/2022   Cc: Midge Minium, MD

## 2022-06-30 NOTE — Patient Instructions (Addendum)
If you are age 69 or older, your body mass index should be between 23-30. Your Body mass index is 31.3 kg/m. If this is out of the aforementioned range listed, please consider follow up with your Primary Care Provider. ________________________________________________________  The [facility] providers would like to encourage you to use [facility] to communicate with providers for non-urgent requests or questions.  Due to long hold times on the telephone, sending your provider a message by [facility] may be a faster and more efficient way to get a response.  Please allow 48 business hours for a response.  Please remember that this is for non-urgent requests.  _______________________________________________________  Stop using NSAIDs i.e. Advil, Excedrin, Ibuprofen, Aleve.. Use Tylenol instead  Continue Miralax as needed  Call the office for any problems with recurrent bleeding  Follow up with Dr. Russella Dar or Mike Gip as needed  Thank you for entrusting me with your care and choosing [facility].  Amy Esterwood, PA-C

## 2022-09-05 ENCOUNTER — Ambulatory Visit: Payer: 59 | Admitting: Gastroenterology

## 2022-09-11 ENCOUNTER — Telehealth: Payer: Self-pay | Admitting: Family Medicine

## 2022-09-11 NOTE — Telephone Encounter (Signed)
I spoke with pt and we have booked her and her husband for tomorrow mo ring video visit

## 2022-09-11 NOTE — Telephone Encounter (Signed)
Caller name: CRETA DORAME  On DPR?: Yes  Call back number: [phone]  Provider they see: Midge Minium, MD  Reason for call: Pt called stating that her and her husband Duffy Bruce) both tested positive for Covid. Both are having symptoms of  sore throat, congestion, headaches, fever and body aches. Pt want to know what they should do next.

## 2022-09-12 ENCOUNTER — Telehealth (INDEPENDENT_AMBULATORY_CARE_PROVIDER_SITE_OTHER): Payer: 59 | Admitting: Family Medicine

## 2022-09-12 ENCOUNTER — Encounter: Payer: Self-pay | Admitting: Family Medicine

## 2022-09-12 DIAGNOSIS — U071 COVID-19: Secondary | ICD-10-CM

## 2022-09-12 MED ORDER — MOLNUPIRAVIR EUA 200MG CAPSULE: 4.0000 | ORAL_CAPSULE | Freq: Two times a day (BID) | ORAL | 0 refills | Status: AC

## 2022-09-12 NOTE — Progress Notes (Signed)
   Virtual Visit via Video   I connected with patient on 09/12/22 at  8:20 AM EST by a video enabled telemedicine application and verified that I am speaking with the correct person using two identifiers.  Location patient: Home Location provider: [facility], Office Persons participating in the virtual visit: Patient, Provider, [place] Marcille Blanco C)  I discussed the limitations of evaluation and management by telemedicine and the availability of in person appointments. The patient expressed understanding and agreed to proceed.  Subjective:   HPI:   COVID- pt tested + yesterday.  + sore throat, congestion, HA, fever.  + body aches.  + cough.  Tm 103.  Currently 99.8  Sxs started Sunday night w/ sore throat.  When she woke Monday morning 'i felt so bad'.  Husband has similar sxs and also tested +.  Denies SOB.  ROS:   See pertinent positives and negatives per HPI.  Patient Active Problem List   Diagnosis Date Noted   Physical exam 04/07/2022   Hot flashes 10/07/2021   Insomnia 10/07/2021   Hyperlipidemia 10/07/2021   Urinary frequency 10/07/2021   Memory change     Social History   Tobacco Use   Smoking status: Former    Packs/day: 1.00    Years: 20.00    Total pack years: 20.00    Types: Cigarettes    Quit date: 03/24/2006    Years since quitting: 16.4   Smokeless tobacco: Never  Substance Use Topics   Alcohol use: No    Current Outpatient Medications:    Calcium-Vitamin D-Vitamin K (VIACTIV PO), Take by mouth. 2 chews a day, Disp: , Rfl:    levothyroxine (SYNTHROID) 50 MCG tablet, TAKE 1 TABLET BY MOUTH DAILY, Disp: 90 tablet, Rfl: 3   doxycycline (MONODOX) 100 MG capsule, Take 100 mg by mouth daily. (Patient not taking: Reported on 09/12/2022), Disp: , Rfl:   Allergies  Allergen Reactions   Penicillins Rash    Objective:   There were no vitals taken for this visit. AAOx3, NAD NCAT, EOMI No obvious CN deficits Coloring WNL Pt is able to speak clearly,  coherently without shortness of breath or increased work of breathing.  Thought process is linear.  Mood is appropriate.   Assessment and Plan:   COVID- new.  Pt and spouse w/ similar sxs and both tested + yesterday.  Symptomatic but thankfully no SOB.  Start Molnupiravir.  Encouraged drinking plenty of fluids, getting lots of rest, and if not already, taking Vit D, Vit C, and Zinc to boost your immune system.  Reviewed supportive care- Tylenol/ibuprofen for body aches/headache/fever, decongestants, cough medications.  Pt to notify if sxs change or worsen.  Pt expressed understanding and is in agreement w/ plan.     Annye Asa, MD 09/12/2022

## 2022-10-27 ENCOUNTER — Other Ambulatory Visit: Payer: Self-pay

## 2022-10-27 ENCOUNTER — Emergency Department (HOSPITAL_COMMUNITY)
Admission: EM | Admit: 2022-10-27 | Discharge: 2022-10-27 | Disposition: A | Discharge status: Home / Self Care | Payer: 59 | Attending: Emergency Medicine | Admitting: Emergency Medicine

## 2022-10-27 ENCOUNTER — Emergency Department (HOSPITAL_COMMUNITY): Payer: 59

## 2022-10-27 DIAGNOSIS — R0781 Pleurodynia: Secondary | ICD-10-CM | POA: Diagnosis not present

## 2022-10-27 DIAGNOSIS — K529 Noninfective gastroenteritis and colitis, unspecified: Secondary | ICD-10-CM | POA: Diagnosis not present

## 2022-10-27 DIAGNOSIS — Z79890 Hormone replacement therapy (postmenopausal): Secondary | ICD-10-CM | POA: Insufficient documentation

## 2022-10-27 DIAGNOSIS — E039 Hypothyroidism, unspecified: Secondary | ICD-10-CM | POA: Insufficient documentation

## 2022-10-27 DIAGNOSIS — R109 Unspecified abdominal pain: Secondary | ICD-10-CM | POA: Diagnosis present

## 2022-10-27 LAB — URINALYSIS, ROUTINE W REFLEX MICROSCOPIC
Bilirubin Urine: NEGATIVE
Glucose, UA: NEGATIVE mg/dL
Hgb urine dipstick: NEGATIVE
Ketones, ur: NEGATIVE mg/dL
Leukocytes,Ua: NEGATIVE
Nitrite: NEGATIVE
Protein, ur: NEGATIVE mg/dL
Specific Gravity, Urine: 1.018 (ref 1.005–1.030)
pH: 5 (ref 5.0–8.0)

## 2022-10-27 LAB — COMPREHENSIVE METABOLIC PANEL
ALT: 12 U/L (ref 0–44)
AST: 15 U/L (ref 15–41)
Albumin: 3.9 g/dL (ref 3.5–5.0)
Alkaline Phosphatase: 62 U/L (ref 38–126)
Anion gap: 8 (ref 5–15)
BUN: 20 mg/dL (ref 8–23)
CO2: 24 mmol/L (ref 22–32)
Calcium: 9.4 mg/dL (ref 8.9–10.3)
Chloride: 107 mmol/L (ref 98–111)
Creatinine, Ser: 0.94 mg/dL (ref 0.44–1.00)
GFR, Estimated: >60 mL/min (ref >60)
Glucose, Bld: 112 mg/dL — ABNORMAL HIGH (ref 70–99)
Potassium: 4 mmol/L (ref 3.5–5.1)
Sodium: 139 mmol/L (ref 135–145)
Total Bilirubin: 0.7 mg/dL (ref 0.3–1.2)
Total Protein: 7 g/dL (ref 6.5–8.1)

## 2022-10-27 LAB — LIPASE, BLOOD: Lipase: 39 U/L (ref 11–51)

## 2022-10-27 LAB — CBC WITH DIFFERENTIAL/PLATELET
Abs Immature Granulocytes: 0.02 10*3/uL (ref 0.00–0.07)
Basophils Absolute: 0 10*3/uL (ref 0.0–0.1)
Basophils Relative: 0 %
Eosinophils Absolute: 0.1 10*3/uL (ref 0.0–0.5)
Eosinophils Relative: 1 %
HCT: 42.2 % (ref 36.0–46.0)
Hemoglobin: 13.6 g/dL (ref 12.0–15.0)
Immature Granulocytes: 0 %
Lymphocytes Relative: 13 %
Lymphs Abs: 1.4 10*3/uL (ref 0.7–4.0)
MCH: 29.1 pg (ref 26.0–34.0)
MCHC: 32.2 g/dL (ref 30.0–36.0)
MCV: 90.4 fL (ref 80.0–100.0)
Monocytes Absolute: 0.6 10*3/uL (ref 0.1–1.0)
Monocytes Relative: 6 %
Neutro Abs: 8 10*3/uL — ABNORMAL HIGH (ref 1.7–7.7)
Neutrophils Relative %: 80 %
Platelets: 285 10*3/uL (ref 150–400)
RBC: 4.67 MIL/uL (ref 3.87–5.11)
RDW: 12.8 % (ref 11.5–15.5)
WBC: 10.1 10*3/uL (ref 4.0–10.5)
nRBC: 0 % (ref 0.0–0.2)

## 2022-10-27 LAB — TROPONIN I (HIGH SENSITIVITY): Troponin I (High Sensitivity): 5 ng/L (ref <18)

## 2022-10-27 MED ORDER — SODIUM CHLORIDE 0.9 % IV BOLUS
1000.0000 mL | Freq: Once | INTRAVENOUS | Status: AC
  Administered 2022-10-27: 1000 mL via INTRAVENOUS

## 2022-10-27 MED ORDER — METRONIDAZOLE 500 MG PO TABS: 500.0000 mg | ORAL_TABLET | Freq: Two times a day (BID) | ORAL | 0 refills | Status: DC

## 2022-10-27 MED ORDER — IOHEXOL 350 MG/ML SOLN
100.0000 mL | Freq: Once | INTRAVENOUS | Status: AC | PRN
Start: 2022-10-27 — End: 2022-10-27
  Administered 2022-10-27: 100 mL via INTRAVENOUS

## 2022-10-27 MED ORDER — METRONIDAZOLE 500 MG PO TABS
500.0000 mg | ORAL_TABLET | Freq: Once | ORAL | Status: AC
  Administered 2022-10-27: 500 mg via ORAL
  Filled 2022-10-27: qty 1

## 2022-10-27 MED ORDER — HYDROCODONE-ACETAMINOPHEN 5-325 MG PO TABS: 1.0000 | ORAL_TABLET | Freq: Four times a day (QID) | ORAL | 0 refills | Status: DC | PRN

## 2022-10-27 MED ORDER — CIPROFLOXACIN HCL 500 MG PO TABS: 500.0000 mg | ORAL_TABLET | Freq: Two times a day (BID) | ORAL | 0 refills | Status: DC

## 2022-10-27 MED ORDER — CIPROFLOXACIN HCL 500 MG PO TABS
500.0000 mg | ORAL_TABLET | Freq: Once | ORAL | Status: AC
  Administered 2022-10-27: 500 mg via ORAL
  Filled 2022-10-27: qty 1

## 2022-10-27 MED ORDER — IOHEXOL 350 MG/ML SOLN: 100.0000 mL | Freq: Once | INTRAVENOUS | Status: DC | PRN

## 2022-10-27 MED ORDER — FAMOTIDINE IN NACL 20-0.9 MG/50ML-% IV SOLN
20.0000 mg | Freq: Once | INTRAVENOUS | Status: AC
  Administered 2022-10-27: 20 mg via INTRAVENOUS
  Filled 2022-10-27: qty 50

## 2022-10-27 MED ORDER — MORPHINE SULFATE (PF) 4 MG/ML IV SOLN
4.0000 mg | Freq: Once | INTRAVENOUS | Status: AC
  Administered 2022-10-27: 4 mg via INTRAVENOUS
  Filled 2022-10-27: qty 1

## 2022-10-27 NOTE — ED Provider Triage Note (Signed)
Emergency Medicine Provider Triage Evaluation Note  Anne Alexander , a 71 y.o. female  was evaluated in triage.  Pt complains of epigastric abdominal pain that started yesterday.  Patient states the pain is worse with walking and changing position.  This is located in the epigastric area, dull, nonradiating.  Denies fever, chest pain, shortness of breath, nausea, vomiting, bowel changes, urinary symptoms.  Review of Systems  Positive: As above Negative: As above  Physical Exam  BP (!) 140/84 (BP Location: Right Arm)   Pulse 73   Temp 98.4 F (36.9 C) (Oral)   SpO2 96%  Gen:   Awake, no distress   Resp:  Normal effort  MSK:   Moves extremities without difficulty  Other:  TTP to epigastric area.  Medical Decision Making  Medically screening exam initiated at 4:11 PM.  Appropriate orders placed.  Anne Alexander was informed that the remainder of the evaluation will be completed by another provider, this initial triage assessment does not replace that evaluation, and the importance of remaining in the ED until their evaluation is complete.     Jeanelle Malling, [place] 10/27/22 [phone]

## 2022-10-27 NOTE — Discharge Instructions (Signed)
You have colitis.  Take Cipro and Flagyl as prescribed.  Take Tylenol or Motrin for pain and Norco for severe pain  See your doctor for follow-up.  You may need to see a GI doctor for colonoscopy  Return to ER if you have worse abdominal pain or vomiting or trouble breathing or chest pain.

## 2022-10-27 NOTE — ED Triage Notes (Signed)
Patient c/o abdominal pain x1day. Pt report increase epigastric pain while taking deep breath or moving. Pt denies N/V/D.

## 2022-10-27 NOTE — ED Provider Notes (Signed)
[facility]-EMERGENCY DEPT Provider Note   CSN: [id number] Arrival date & time: 10/27/22  1530     History  Chief Complaint  Patient presents with   Abdominal Pain    Anne Alexander is a 71 y.o. female history of hypothyroidism here presenting with abdominal pain and chest pain.  Patient states that since yesterday she has been having epigastric pain.  She states it is worse with movement and worse with deep breathing.  Denies any leg swelling or recent travel.  Denies any chest pain.  Patient states that she is chronically constipated and did take some Dulcolax and last bowel movement was 2 to 3 days ago.  Denies any previous abdominal surgeries.  The history is provided by the patient.       Home Medications Prior to Admission medications   Medication Sig Start Date End Date Taking? Authorizing Provider  Calcium-Vitamin D-Vitamin K (VIACTIV PO) Take by mouth. 2 chews a day    [provider]  doxycycline (MONODOX) 100 MG capsule Take 100 mg by mouth daily. Patient not taking: Reported on 09/12/2022 06/12/22   [provider]  levothyroxine (SYNTHROID) 50 MCG tablet TAKE 1 TABLET BY MOUTH DAILY 02/06/22   Sheliah Hatch, MD      Allergies    Penicillins    Review of Systems   Review of Systems  Gastrointestinal:  Positive for abdominal pain.  All other systems reviewed and are negative.   Physical Exam Updated Vital Signs BP (!) 155/67   Pulse 66   Temp 98.4 F (36.9 C) (Oral)   Resp 12   SpO2 97%  Physical Exam Vitals and nursing note reviewed.  Constitutional:      Comments: Uncomfortable  HENT:     Head: Normocephalic.     Mouth/Throat:     Pharynx: Oropharynx is clear.  Eyes:     Extraocular Movements: Extraocular movements intact.     Pupils: Pupils are equal, round, and reactive to light.  Cardiovascular:     Rate and Rhythm: Normal rate and regular rhythm.     Heart sounds: Normal heart sounds.   Pulmonary:     Effort: Pulmonary effort is normal.     Breath sounds: Normal breath sounds.  Abdominal:     General: Abdomen is flat.     Comments: + Epigastric tenderness  Skin:    Capillary Refill: Capillary refill takes less than 2 seconds.  Neurological:     General: No focal deficit present.  Psychiatric:        Mood and Affect: Mood normal.     ED Results / Procedures / Treatments   Labs (all labs ordered are listed, but only abnormal results are displayed) Labs Reviewed  CBC WITH DIFFERENTIAL/PLATELET - Abnormal; Notable for the following components:      Result Value   Neutro Abs 8.0 (*)    All other components within normal limits  COMPREHENSIVE METABOLIC PANEL - Abnormal; Notable for the following components:   Glucose, Bld 112 (*)    All other components within normal limits  LIPASE, BLOOD  URINALYSIS, ROUTINE W REFLEX MICROSCOPIC  TROPONIN I (HIGH SENSITIVITY)  TROPONIN I (HIGH SENSITIVITY)    EKG EKG Interpretation  Date/Time:  Friday October 27 2022 20:18:22 EST Ventricular Rate:  69 PR Interval:  160 QRS Duration: 89 QT Interval:  393 QTC Calculation: 421 R Axis:   -40 Text Interpretation: Sinus rhythm Left axis deviation Borderline T abnormalities, anterior leads No  significant change since last tracing Confirmed by Richardean Canal ([id number]) on 10/27/2022 8:19:50 PM  Radiology CT Angio Chest PE W and/or Wo Contrast  Result Date: 10/27/2022 CLINICAL DATA:  Concern for pulmonary embolism.  Abdominal pain. EXAM: CT ANGIOGRAPHY CHEST CT ABDOMEN AND PELVIS WITH CONTRAST TECHNIQUE: Multidetector CT imaging of the chest was performed using the standard protocol during bolus administration of intravenous contrast. Multiplanar CT image reconstructions and MIPs were obtained to evaluate the vascular anatomy. Multidetector CT imaging of the abdomen and pelvis was performed using the standard protocol during bolus administration of intravenous contrast. RADIATION  DOSE REDUCTION: This exam was performed according to the departmental dose-optimization program which includes automated exposure control, adjustment of the mA and/or kV according to patient size and/or use of iterative reconstruction technique. CONTRAST:  OMNIPAQUE IOHEXOL 350 MG/ML SOLN COMPARISON:  None Available. FINDINGS: CTA CHEST FINDINGS Cardiovascular: There is no cardiomegaly or pericardial effusion. There is mild atherosclerotic calcification of the thoracic aorta. No aneurysmal dilatation. Evaluation of the pulmonary arteries is limited due to respiratory motion. No pulmonary artery embolus identified. Mediastinum/Nodes: No hilar or mediastinal adenopathy. The esophagus is grossly unremarkable. Asymmetric enlargement of the left thyroid lobe with mild mass effect on the trachea. Patient has history of multinodular goiter. This has been evaluated on previous imaging. (ref: J Am Coll Radiol. 2015 Feb;12(2): 143-50).No mediastinal fluid collection. Lungs/Pleura: No focal consolidation, pleural effusion, or pneumothorax. The central airways are patent. Musculoskeletal: No acute osseous pathology.  Lower cervical ACDF. Review of the MIP images confirms the above findings. CT ABDOMEN and PELVIS FINDINGS No intra-abdominal free air or free fluid. Hepatobiliary: No focal liver abnormality is seen. No gallstones, gallbladder wall thickening, or biliary dilatation. Pancreas: Unremarkable. No pancreatic ductal dilatation or surrounding inflammatory changes. Spleen: Normal in size without focal abnormality. Adrenals/Urinary Tract: The adrenal glands are unremarkable. There is a 9 mm nonobstructing right renal mid to lower pole calculus. No hydronephrosis. Subcentimeter bilateral renal hypodense lesions are too small to characterize. No imaging follow-up. There is no hydronephrosis on the left. The visualized ureters and urinary bladder appear unremarkable. Stomach/Bowel: There is severe sigmoid and distal  colonic diverticulosis and scattered colonic diverticula without active inflammation. There is large amount of stool throughout the colon. There is mild inflammatory changes and stranding of the omentum adjacent to a segment of mid transverse colon. No definite diverticula noted in this region. Findings may represent mild segmental colitis. There is no bowel obstruction. The appendix is not visualized with certainty. No inflammatory changes identified in the right lower quadrant. Vascular/Lymphatic: Advanced aortoiliac atherosclerotic disease. The IVC is unremarkable. No portal venous gas. There is no adenopathy. Reproductive: Hysterectomy.  No adnexal masses. Other: None Musculoskeletal: Degenerative changes of the lower lumbar spine. No acute osseous pathology. Review of the MIP images confirms the above findings. IMPRESSION: 1. No acute intrathoracic pathology. No CT evidence of pulmonary artery embolus. 2. Findings suspicious for mild segmental colitis involving the mid transverse colon. No bowel obstruction. 3. Severe sigmoid and distal colonic diverticulosis. 4. A 9 mm nonobstructing right renal mid to lower pole calculus. No hydronephrosis. 5.  Aortic Atherosclerosis (ICD10-I70.0). Electronically Signed   By: Elgie Collard M.D.   On: 10/27/2022 22:25   CT ABDOMEN PELVIS W CONTRAST  Result Date: 10/27/2022 CLINICAL DATA:  Concern for pulmonary embolism.  Abdominal pain. EXAM: CT ANGIOGRAPHY CHEST CT ABDOMEN AND PELVIS WITH CONTRAST TECHNIQUE: Multidetector CT imaging of the chest was performed using the standard protocol during bolus  administration of intravenous contrast. Multiplanar CT image reconstructions and MIPs were obtained to evaluate the vascular anatomy. Multidetector CT imaging of the abdomen and pelvis was performed using the standard protocol during bolus administration of intravenous contrast. RADIATION DOSE REDUCTION: This exam was performed according to the departmental  dose-optimization program which includes automated exposure control, adjustment of the mA and/or kV according to patient size and/or use of iterative reconstruction technique. CONTRAST:  OMNIPAQUE IOHEXOL 350 MG/ML SOLN COMPARISON:  None Available. FINDINGS: CTA CHEST FINDINGS Cardiovascular: There is no cardiomegaly or pericardial effusion. There is mild atherosclerotic calcification of the thoracic aorta. No aneurysmal dilatation. Evaluation of the pulmonary arteries is limited due to respiratory motion. No pulmonary artery embolus identified. Mediastinum/Nodes: No hilar or mediastinal adenopathy. The esophagus is grossly unremarkable. Asymmetric enlargement of the left thyroid lobe with mild mass effect on the trachea. Patient has history of multinodular goiter. This has been evaluated on previous imaging. (ref: J Am Coll Radiol. 2015 Feb;12(2): 143-50).No mediastinal fluid collection. Lungs/Pleura: No focal consolidation, pleural effusion, or pneumothorax. The central airways are patent. Musculoskeletal: No acute osseous pathology.  Lower cervical ACDF. Review of the MIP images confirms the above findings. CT ABDOMEN and PELVIS FINDINGS No intra-abdominal free air or free fluid. Hepatobiliary: No focal liver abnormality is seen. No gallstones, gallbladder wall thickening, or biliary dilatation. Pancreas: Unremarkable. No pancreatic ductal dilatation or surrounding inflammatory changes. Spleen: Normal in size without focal abnormality. Adrenals/Urinary Tract: The adrenal glands are unremarkable. There is a 9 mm nonobstructing right renal mid to lower pole calculus. No hydronephrosis. Subcentimeter bilateral renal hypodense lesions are too small to characterize. No imaging follow-up. There is no hydronephrosis on the left. The visualized ureters and urinary bladder appear unremarkable. Stomach/Bowel: There is severe sigmoid and distal colonic diverticulosis and scattered colonic diverticula without active  inflammation. There is large amount of stool throughout the colon. There is mild inflammatory changes and stranding of the omentum adjacent to a segment of mid transverse colon. No definite diverticula noted in this region. Findings may represent mild segmental colitis. There is no bowel obstruction. The appendix is not visualized with certainty. No inflammatory changes identified in the right lower quadrant. Vascular/Lymphatic: Advanced aortoiliac atherosclerotic disease. The IVC is unremarkable. No portal venous gas. There is no adenopathy. Reproductive: Hysterectomy.  No adnexal masses. Other: None Musculoskeletal: Degenerative changes of the lower lumbar spine. No acute osseous pathology. Review of the MIP images confirms the above findings. IMPRESSION: 1. No acute intrathoracic pathology. No CT evidence of pulmonary artery embolus. 2. Findings suspicious for mild segmental colitis involving the mid transverse colon. No bowel obstruction. 3. Severe sigmoid and distal colonic diverticulosis. 4. A 9 mm nonobstructing right renal mid to lower pole calculus. No hydronephrosis. 5.  Aortic Atherosclerosis (ICD10-I70.0). Electronically Signed   By: Elgie Collard M.D.   On: 10/27/2022 22:25    Procedures Procedures    Medications Ordered in ED Medications  ciprofloxacin (CIPRO) tablet 500 mg (has no administration in time range)  metroNIDAZOLE (FLAGYL) tablet 500 mg (has no administration in time range)  morphine (PF) 4 MG/ML injection 4 mg (4 mg Intravenous Given 10/27/22 2009)  famotidine (PEPCID) IVPB 20 mg premix (20 mg Intravenous New Bag/Given 10/27/22 2018)  sodium chloride 0.9 % bolus 1,000 mL (1,000 mLs Intravenous New Bag/Given 10/27/22 2228)  iohexol (OMNIPAQUE) 350 MG/ML injection 100 mL (100 mLs Intravenous Contrast Given 10/27/22 2201)    ED Course/ Medical Decision Making/ A&P  Medical Decision Making Anne Alexander is a 71 y.o. female here presenting  with epigastric pain and pleuritic chest pain.  Concern for possible PE versus gastritis versus pancreatitis versus small bowel obstruction.  Plan to get CBC and CMP and lipase and troponin and CT abdomen pelvis and CTA chest.  10:40 PM Reviewed patient's labs and independently interpreted CT scan.  Patient's white count is normal.  Urinalysis unremarkable.  CT showed colitis involving the transverse colon which explains her pain.  Patient is feeling better now.  Patient has a penicillin allergy.  Will discharge home with Cipro and Flagyl.  Give short course of Vicodin for pain.  Problems Addressed: Colitis: acute illness or injury  Amount and/or Complexity of Data Reviewed Labs: ordered. Decision-making details documented in ED Course. Radiology: ordered and independent interpretation performed. Decision-making details documented in ED Course. ECG/medicine tests: ordered.  Risk Prescription drug management.    Final Clinical Impression(s) / ED Diagnoses Final diagnoses:  None    Rx / DC Orders ED Discharge Orders     None         Charlynne PanderYao, Linley Moxley Hsienta, MD 10/27/22 2241

## 2022-11-28 ENCOUNTER — Telehealth: Payer: Self-pay

## 2022-11-28 DIAGNOSIS — E039 Hypothyroidism, unspecified: Secondary | ICD-10-CM

## 2022-11-28 MED ORDER — LEVOTHYROXINE SODIUM 50 MCG PO TABS: 50.0000 ug | ORAL_TABLET | Freq: Every day | ORAL | 3 refills | Status: DC

## 2022-11-28 NOTE — Telephone Encounter (Signed)
Pt is calling to update Rx and location due to change in insurance the original pharmacy is not accepted   Pt needs Rx levothyroxine 58mcg  Sent to [facility] [address] [place]   90 day supply is being requested

## 2022-12-15 ENCOUNTER — Telehealth: Payer: Self-pay | Admitting: Genetic Counselor

## 2022-12-15 ENCOUNTER — Encounter: Payer: Self-pay | Admitting: Family Medicine

## 2022-12-15 ENCOUNTER — Ambulatory Visit (INDEPENDENT_AMBULATORY_CARE_PROVIDER_SITE_OTHER): Payer: PPO | Admitting: Family Medicine

## 2022-12-15 VITALS — BP 122/80 | HR 55 | Temp 98.0°F | Ht 65.0 in | Wt 185.6 lb

## 2022-12-15 DIAGNOSIS — Z8481 Family history of carrier of genetic disease: Secondary | ICD-10-CM

## 2022-12-15 DIAGNOSIS — R058 Other specified cough: Secondary | ICD-10-CM

## 2022-12-15 MED ORDER — PREDNISONE 10 MG PO TABS: ORAL_TABLET | ORAL | 0 refills | Status: DC

## 2022-12-15 NOTE — Assessment & Plan Note (Signed)
Pt referred to Genetics now that sister has tested + for BRCA

## 2022-12-15 NOTE — Patient Instructions (Addendum)
Follow up as needed or as scheduled START the Prednisone as directed- take w/ food Drink LOTS of fluids We'll call you to schedule with genetics Call with any questions or concerns Hang in there!!

## 2022-12-15 NOTE — Telephone Encounter (Signed)
Scheduled appt per 2/9 referral. Pt is aware of appt date and time. Pt is aware to arrive 15 mins prior to appt time and to bring and updated insurance card. Pt is aware of appt location.

## 2022-12-15 NOTE — Progress Notes (Signed)
   Subjective:    Patient ID: Anne Alexander, female    DOB: January 26, 1951, 72 y.o.   MRN: [id number]  HPI Sore throat- pt reports sxs started 'at least 2 weeks' ago.  + congestion, PND.  Cough is productive of dark sputum.  Nasal drainage has been bloody.  No fever.  Pt reports 'i feel pretty good'.  Denies sinus pain/pressure.  Occasional HA.  No ear pain.  No dizziness.  No N/V.  No known sick contacts.  + family hx of BRCA gene- mom had breast cancer, sister has gene.   Review of Systems For ROS see HPI     Objective:   Physical Exam Vitals reviewed.  Constitutional:      General: She is not in acute distress.    Appearance: Normal appearance. She is well-developed. She is not ill-appearing.  HENT:     Alexander: Normocephalic and atraumatic.     Right Ear: Tympanic membrane normal.     Left Ear: Tympanic membrane normal.     Nose: Mucosal edema and congestion present. No rhinorrhea.     Right Sinus: No maxillary sinus tenderness or frontal sinus tenderness.     Left Sinus: No maxillary sinus tenderness or frontal sinus tenderness.     Mouth/Throat:     Pharynx: Posterior oropharyngeal erythema (w/ PND) present.  Eyes:     Conjunctiva/sclera: Conjunctivae normal.     Pupils: Pupils are equal, round, and reactive to light.  Cardiovascular:     Rate and Rhythm: Normal rate and regular rhythm.     Heart sounds: Normal heart sounds.  Pulmonary:     Effort: Pulmonary effort is normal. No respiratory distress.     Breath sounds: Normal breath sounds. No wheezing or rales.  Musculoskeletal:     Cervical back: Normal range of motion and neck supple.  Lymphadenopathy:     Cervical: No cervical adenopathy.  Skin:    General: Skin is warm and dry.  Neurological:     General: No focal deficit present.     Mental Status: She is alert and oriented to person, place, and time.  Psychiatric:        Mood and Affect: Mood normal.        Behavior: Behavior normal.        Thought Content:  Thought content normal.           Assessment & Plan:  Post viral cough- new.  Pt's sxs are consistent w/ PND and airway inflammation.  No evidence of bacterial infxn so abx are not needed.  Start prednisone taper.  Reviewed supportive care and red flags that should prompt return.  Pt expressed understanding and is in agreement w/ plan.

## 2022-12-19 ENCOUNTER — Encounter: Payer: Self-pay | Admitting: Nurse Practitioner

## 2022-12-19 ENCOUNTER — Ambulatory Visit (INDEPENDENT_AMBULATORY_CARE_PROVIDER_SITE_OTHER): Payer: PPO | Admitting: Nurse Practitioner

## 2022-12-19 VITALS — BP 124/80 | Ht 65.0 in | Wt 185.0 lb

## 2022-12-19 DIAGNOSIS — N8110 Cystocele, unspecified: Secondary | ICD-10-CM

## 2022-12-19 DIAGNOSIS — Z780 Asymptomatic menopausal state: Secondary | ICD-10-CM | POA: Diagnosis not present

## 2022-12-19 DIAGNOSIS — N816 Rectocele: Secondary | ICD-10-CM | POA: Diagnosis not present

## 2022-12-19 DIAGNOSIS — Z01419 Encounter for gynecological examination (general) (routine) without abnormal findings: Secondary | ICD-10-CM

## 2022-12-19 DIAGNOSIS — M8588 Other specified disorders of bone density and structure, other site: Secondary | ICD-10-CM

## 2022-12-19 NOTE — Progress Notes (Signed)
Anne Alexander Apr 21, 1951 [id number]   History:  72 y.o. G1P1001 presents for breast and pelvic exam. Postmenopausal - no HRT. S/P TAH BSO for endometriosis in her 56s. Normal pap history. History of hypothyroidism. 12/2020 bladder suspension, and anterior/posterior repair.   Gynecologic History No LMP recorded. Patient has had a hysterectomy.   Contraception: status post hysterectomy Sexually active: No  Health Maintenance Last Pap: 05/11/2017. Results were: Normal Last mammogram: 05/31/2022 (diagnostic). Results were: Normal Last colonoscopy: 06/22/2020. Results were: Normal, 10-year recall Last Dexa: 10/19/2020. Results were: T-score -0.7, FRAX 7.8% / 0.5% (spine -1.6)  Past medical history, past surgical history, family history and social history were all reviewed and documented in the EPIC chart. Married. Retired from [employer]. Daughter, lives local. Yolanda Bonine at [facility] [employer], plays golf. Mother diagnosed with breast cancer at age 21, sister is BRCA positive.   ROS:  A ROS was performed and pertinent positives and negatives are included.  Exam:  Vitals:   12/19/22 1331  BP: 124/80  Weight: 185 lb (83.9 kg)  Height: 5' 5"$  (1.651 m)    Body mass index is 30.79 kg/m.  General appearance:  Normal Thyroid:  Symmetrical, normal in size, without palpable masses or nodularity. Respiratory  Auscultation:  Clear without wheezing or rhonchi Cardiovascular  Auscultation:  Regular rate, without rubs, murmurs or gallops  Edema/varicosities:  Not grossly evident Abdominal  Soft,nontender, without masses, guarding or rebound.  Liver/spleen:  No organomegaly noted  Hernia:  None appreciated  Skin  Inspection:  Grossly normal Breasts: Examined lying and sitting.   Right: Without masses, retractions, nipple discharge or axillary adenopathy. Area of fibrous tissue in upper right breast.    Left: Without masses, retractions, nipple discharge or axillary adenopathy. Genitourinary    Inguinal/mons:  Normal without inguinal adenopathy  External genitalia:  Normal appearing vulva with no masses, tenderness, or lesions  BUS/Urethra/Skene's glands:  Normal  Vagina:  Normal appearing with normal color and discharge, no lesions. Some vaginal wall prolapse, rectocele, cystocele  Cervix:  Absent  Uterus:  Absent  Adnexa/parametria:     Rt: Normal in size, without masses or tenderness.   Lt: Normal in size, without masses or tenderness.  Anus and perineum: Normal  Digital rectal exam: Deferred  Patient informed chaperone available to be present for breast and pelvic exam. Patient has requested no chaperone to be present. Patient has been advised what will be completed during breast and pelvic exam.   Assessment/Plan:  72 y.o. G1P1001 for breast and pelvic exam.   Encounter for breast and pelvic examination - Education provided on SBEs, importance of preventative screenings, current guidelines, high calcium diet, regular exercise, and multivitamin daily.  Labs with PCP.  Postmenopausal - no HRT. S/P TAH BSO in her 89s for endometriosis.   Osteopenia of spine - Plan: DG Bone Density. T-score -1.6 in spine, all other sites normal. Recommend repeating DXA now. Continue Vit D + calcium and regular exercise.   Cystocele with rectocele - Does have trouble getting stool out at times. Recommend squatty potty, splinting if needed, and preventing constipation. Some stress incontinence and occasional urge incontinence if she does not go to the bathroom for a long time.   Screening for cervical cancer - Normal Pap history. No longer screening per guidelines.   Screening for breast cancer - Normal mammogram history.  Continue annual screenings. Mother diagnosed with breast cancer at age 49, sister is BRCA positive. Recommend genetic testing. Reports PCP is setting this up.  Screening for colon cancer - 2021 colonoscopy. Will repeat at GI's recommended interval.   Return in 2 years for  breast and pelvic exam.     Tamela Gammon DNP, 1:58 PM 12/19/2022

## 2022-12-25 ENCOUNTER — Telehealth: Payer: Self-pay | Admitting: Genetic Counselor

## 2022-12-25 NOTE — Telephone Encounter (Signed)
Sister Chaya Jan (DOB: 03/18/1952) called and gave the [facility] Team verbal permission to share her pedigree and her genetic testing results with her sister, Bev, who is scheduled to see genetics in April 2024.

## 2023-01-03 ENCOUNTER — Other Ambulatory Visit: Payer: Self-pay | Admitting: Nurse Practitioner

## 2023-01-03 ENCOUNTER — Ambulatory Visit (INDEPENDENT_AMBULATORY_CARE_PROVIDER_SITE_OTHER): Payer: PPO

## 2023-01-03 DIAGNOSIS — Z13820 Encounter for screening for osteoporosis: Secondary | ICD-10-CM | POA: Diagnosis not present

## 2023-01-03 DIAGNOSIS — Z780 Asymptomatic menopausal state: Secondary | ICD-10-CM | POA: Diagnosis not present

## 2023-01-03 DIAGNOSIS — M8588 Other specified disorders of bone density and structure, other site: Secondary | ICD-10-CM

## 2023-01-03 DIAGNOSIS — N8110 Cystocele, unspecified: Secondary | ICD-10-CM

## 2023-01-03 DIAGNOSIS — Z01419 Encounter for gynecological examination (general) (routine) without abnormal findings: Secondary | ICD-10-CM

## 2023-01-05 DIAGNOSIS — M25512 Pain in left shoulder: Secondary | ICD-10-CM | POA: Insufficient documentation

## 2023-02-07 ENCOUNTER — Other Ambulatory Visit: Payer: Self-pay

## 2023-02-07 ENCOUNTER — Encounter: Payer: Self-pay | Admitting: Genetic Counselor

## 2023-02-07 ENCOUNTER — Other Ambulatory Visit: Payer: Self-pay | Admitting: Genetic Counselor

## 2023-02-07 ENCOUNTER — Inpatient Hospital Stay: Payer: PPO | Attending: Genetic Counselor | Admitting: Genetic Counselor

## 2023-02-07 ENCOUNTER — Inpatient Hospital Stay: Payer: PPO

## 2023-02-07 DIAGNOSIS — Z803 Family history of malignant neoplasm of breast: Secondary | ICD-10-CM

## 2023-02-07 LAB — GENETIC SCREENING ORDER

## 2023-02-07 NOTE — Progress Notes (Addendum)
REFERRING PROVIDER: Sheliah Hatch, MD [address] [place],  [place] [address]  PRIMARY PROVIDER:  Sheliah Hatch, MD  PRIMARY REASON FOR VISIT:  1. Family history of breast cancer      HISTORY OF PRESENT ILLNESS:   [name], a 72 y.o. female, was seen for a [place] cancer genetics consultation at the request of Dr. Beverely Low due to a family history of cancer.  [name] presents to clinic today to discuss the possibility of a hereditary predisposition to cancer, genetic testing, and to further clarify her future cancer risks, as well as potential cancer risks for family members.   [name] is a 72 y.o. female with no personal history of cancer.  Her sister underwent genetic testing and had a VUS in PMS2, but was essentially a negative genetic test.  CANCER HISTORY:  Oncology History   No history exists.     RISK FACTORS:  Menarche was at age 35.  First live birth at age 72.  Ovaries intact: no.  Hysterectomy: yes.  Menopausal status: postmenopausal.  HRT use:  30  years. Colonoscopy: yes; normal. Mammogram within the last year: yes. Number of breast biopsies: 0. Up to date with pelvic exams: yes. Any excessive radiation exposure in the past: no  Past Medical History:  Diagnosis Date   Allergy    Cataract    forming very small    COVID 07/27/2020   felt bad fever fatigue loss of taste and smell x 10 days infusion given   Eczema    GERD (gastroesophageal reflux disease)    High cholesterol    History of hiatal hernia    Hypothyroidism    Memory change    Osteopenia 06/2018   T score -2.1 FRAX 14% / 0.3%   Rosacea    Sleep deficient    SUI (stress urinary incontinence, female)    Vaginal prolapse    Wears glasses     Past Surgical History:  Procedure Laterality Date   ANTERIOR AND POSTERIOR REPAIR WITH SACROSPINOUS FIXATION N/A 01/03/2021   Procedure: SACROSPINOUS LIGAMENT FIXATION;  Surgeon: Marguerita Beards, MD;  Location:  [facility];  Service: Gynecology;  Laterality: N/A;  total time needed for all 4 procedures is 2 hours   BLADDER SUSPENSION  yrs ago   Raz bladder suspension   BLADDER SUSPENSION N/A 01/03/2021   Procedure: TRANSVAGINAL TAPE (TVT) PROCEDURE;  Surgeon: Marguerita Beards, MD;  Location: [facility];  Service: Gynecology;  Laterality: N/A;   CATARACT EXTRACTION     colonscopy  2021   CYSTOSCOPY N/A 01/03/2021   Procedure: CYSTOSCOPY;  Surgeon: Marguerita Beards, MD;  Location: [facility];  Service: Gynecology;  Laterality: N/A;   ingrown toenail removed  yrs ago   NECK SURGERY  6-7 yrs ago   cervical has plates and screws dr Jule Ser   OOPHORECTOMY Bilateral age 72   endometriosis   RECTOCELE REPAIR  01/03/2021   Procedure: POSTERIOR REPAIR (RECTOCELE);  Surgeon: Marguerita Beards, MD;  Location: [facility];  Service: Gynecology;;   TONSILLECTOMY  as child   VAGINAL HYSTERECTOMY  age 72   partial then complete    Social History   Socioeconomic History   Marital status: Married    Spouse name: Nedra Hai   Number of children: 1   Years of education: 13   Highest education level: Not on file  Occupational History    Employer: [employer]  Tobacco Use   Smoking status: Former    Packs/day: 1.00    Years: 20.00    Additional pack years: 0.00    Total pack years: 20.00    Types: Cigarettes    Quit date: 03/24/2006    Years since quitting: 16.8   Smokeless tobacco: Never  Vaping Use   Vaping Use: Never used  Substance and Sexual Activity   Alcohol use: No   Drug use: No   Sexual activity: Not Currently    Comment: Pt. declined sexual hx questions  Other Topics Concern   Not on file  Social History Narrative   Patient lives at home with her husband and works full time at [employer] [place].    Education one year of college.   Drinks about 2 cups of coffee a day, and 1 soda a day    Social Determinants of  Corporate investment banker Strain: Not on file  Food Insecurity: Not on file  Transportation Needs: Not on file  Physical Activity: Not on file  Stress: Not on file  Social Connections: Not on file     FAMILY HISTORY:  We obtained a detailed, 4-generation family history.  Significant diagnoses are listed below: Family History  Problem Relation Age of Onset   Dementia Mother    Breast cancer Mother 88       bilateral   Cervical cancer Mother 66   Heart Problems Sister    Dementia Brother    Throat cancer Maternal Uncle 21   Lung cancer Maternal Uncle 7   Alzheimer's disease Maternal Grandfather    Breast cancer Cousin 39     The patient has one daughter who is cancer free.  She has one sister and two brothers.  Her sister had genetic testing and a VUS in PMS2 was found.  Both parents are deceased.  The patient's mother was diagnosed with cervical cancer at 61 and breast cancer in her early 26's.  She had two brothers, one had throat and pancreatic cancer and the other had lung cancer.  One brother had a daughter with breast cancer at 83.  The maternal grandparents are deceased.  The patient's father died ina car accident.  He had 6-7 siblings who were cancer free.  His parents died of non-cancer related issues.  Ms. Gogue is aware of previous family history of genetic testing for hereditary cancer risks.   GENETIC COUNSELING ASSESSMENT: [name] is a 72 y.o. female with a family history of breast cancer which is somewhat suggestive of a hereditary cancer syndrome and predisposition to cancer given the number of breast cancer cases and young age of onset of her cousin. We, therefore, discussed and recommended the following at today's visit.   DISCUSSION: We discussed that, in general, most cancer is not inherited in families, but instead is sporadic or familial. Sporadic cancers occur by chance and typically happen at older ages (>50 years) as this type of cancer is caused  by genetic changes acquired during an individual's lifetime. Some families have more cancers than would be expected by chance; however, the ages or types of cancer are not consistent with a known genetic mutation or known genetic mutations have been ruled out. This type of familial cancer is thought to be due to a combination of multiple genetic, environmental, hormonal, and lifestyle factors. While this combination of factors likely increases the risk of cancer, the exact source of this risk is not currently identifiable or  testable.  We discussed that 5 - 10% of breast cancer is hereditary, with most cases associated with BRCA mutations.  There are other genes that can be associated with hereditary breast cancer syndromes.  These include ATM, CHEK2 and PALB2.  We discussed that testing is beneficial for several reasons including knowing how to follow individuals after completing their treatment, identifying whether potential treatment options such as PARP inhibitors would be beneficial, and understand if other family members could be at risk for cancer and allow them to undergo genetic testing.   We reviewed the characteristics, features and inheritance patterns of hereditary cancer syndromes. We also discussed genetic testing, including the appropriate family members to test, the process of testing, insurance coverage and turn-around-time for results. We discussed the implications of a negative, positive, carrier and/or variant of uncertain significant result. Ms. Flaming  was offered a common hereditary cancer panel (47 genes) and an expanded pan-cancer panel (77 genes). Ms. Hafen was informed of the benefits and limitations of each panel, including that expanded pan-cancer panels contain genes that do not have clear management guidelines at this point in time.  We also discussed that as the number of genes included on a panel increases, the chances of variants of uncertain significance increases. Ms.  Laisure decided to pursue genetic testing for the CancerNext-Expanded+RNAinsight gene panel.   The CancerNext-Expanded gene panel offered by [employer] and includes sequencing and rearrangement analysis for the following 71 genes: AIP, ALK, APC, ATM, BAP1, BARD1, BMPR1A, BRCA1, BRCA2, BRIP1, CDC73, CDH1, CDK4, CDKN1B, CDKN2A, CHEK2, DICER1, FH, FLCN, KIF1B, LZTR1, MAX, MEN1, MET, MLH1, MSH2, MSH6, MUTYH, NF1, NF2, NTHL1, PALB2, PHOX2B, PMS2, POT1, PRKAR1A, PTCH1, PTEN, RAD51C, RAD51D, RB1, RET, SDHA, SDHAF2, SDHB, SDHC, SDHD, SMAD4, SMARCA4, SMARCB1, SMARCE1, STK11, SUFU, TMEM127, TP53, TSC1, TSC2 and VHL (sequencing and deletion/duplication); AXIN2, CTNNA1, EGFR, EGLN1, HOXB13, KIT, MITF, MSH3, PDGFRA, POLD1 and POLE (sequencing only); EPCAM and GREM1 (deletion/duplication only). RNA data is routinely analyzed for use in variant interpretation for all genes.   Based on Ms. Armetta's family history of cancer, she meets medical criteria for genetic testing. Despite that she meets criteria, she may still have an out of pocket cost. We discussed that if her out of pocket cost for testing is over $100, the laboratory will call and confirm whether she wants to proceed with testing.  If the out of pocket cost of testing is less than $100 she will be billed by the genetic testing laboratory.   Based on the patient's family history, a statistical model (Tyrer Cusik) was used to estimate her risk of developing breast cancer. This estimates her lifetime risk of developing breast cancer to be approximately 11.5%. This estimation does not consider any genetic testing results.  The patient's lifetime breast cancer risk is a preliminary estimate based on available information using one of several models endorsed by the American Cancer Society (ACS). The ACS recommends consideration of breast MRI screening as an adjunct to mammography for patients at high risk (defined as 20% or greater lifetime risk). Please note that  a woman's breast cancer risk changes over time. It may increase or decrease based on age and any changes to the personal and/or family medical history. The risks and recommendations listed above apply to this patient at this point in time. In the future, she may or may not be eligible for the same medical management strategies and, in some cases, other medical management strategies may become available to her. If she is interested in  an updated breast cancer risk assessment at a later date, she can contact us.    PLAN: After considering the risks, benefits, and limitations, Ms. Salmonson provided informed consent to pursue genetic testing and the blood sample was sent to [facility] for analysis of the CancerNext-Expanded+RNAinsight. Results should be available within approximately 2-3 weeks' time, at which point they will be disclosed by telephone to Ms. Wisener, as will any additional recommendations warranted by these results. Ms. Minnis will receive a summary of her genetic counseling visit and a copy of her results once available. This information will also be available in Epic.   Lastly, we encouraged Ms. Borge to remain in contact with cancer genetics annually so that we can continuously update the family history and inform her of any changes in cancer genetics and testing that may be of benefit for this family.   Ms. Torcivia questions were answered to her satisfaction today. Our contact information was provided should additional questions or concerns arise. Thank you for the referral and allowing [place] to share in the care of your patient.   Gwenn Teodoro P. Lowell Guitar, MS, [facility] Licensed, Patent attorney [email or web address] phone: [phone]  The patient was seen for a total of 30 minutes in face-to-face genetic counseling.  The patient brought her husband. Drs. Meliton Rattan, and/or [place] were available for questions, if  needed.   _______________________________________________________________________ For Office Staff:  Number of people involved in session: 2 Was an Intern/ student involved with case: no

## 2023-02-20 DIAGNOSIS — D1039 Benign neoplasm of other parts of mouth: Secondary | ICD-10-CM | POA: Diagnosis not present

## 2023-03-02 ENCOUNTER — Encounter: Payer: Self-pay | Admitting: Genetic Counselor

## 2023-03-02 DIAGNOSIS — Z1379 Encounter for other screening for genetic and chromosomal anomalies: Secondary | ICD-10-CM | POA: Insufficient documentation

## 2023-03-05 ENCOUNTER — Ambulatory Visit: Payer: Self-pay | Admitting: Genetic Counselor

## 2023-03-05 ENCOUNTER — Telehealth: Payer: Self-pay | Admitting: Genetic Counselor

## 2023-03-05 DIAGNOSIS — Z1379 Encounter for other screening for genetic and chromosomal anomalies: Secondary | ICD-10-CM

## 2023-03-05 NOTE — Telephone Encounter (Signed)
Revealed negative genetic testing.  Discussed that we do not know why there is cancer in the family. It could be due to a different gene that we are not testing, or maybe our current technology may not be able to pick something up.  It will be important for her to keep in contact with genetics to keep up with whether additional testing may be needed.  

## 2023-03-05 NOTE — Progress Notes (Addendum)
HPI:  Anne Alexander was previously seen in the [place] [facility] due to a family history of cancer and concerns regarding a hereditary predisposition to cancer. Please refer to our prior cancer genetics clinic note for more information regarding our discussion, assessment and recommendations, at the time. Anne Alexander recent genetic test results were disclosed to her, as were recommendations warranted by these results. These results and recommendations are discussed in more detail below.  CANCER HISTORY:  Oncology History   No history exists.    FAMILY HISTORY:  We obtained a detailed, 4-generation family history.  Significant diagnoses are listed below: Family History  Problem Relation Age of Onset   Dementia Mother    Breast cancer Mother 79       bilateral   Cervical cancer Mother 78   Heart Problems Sister    Dementia Brother    Throat cancer Maternal Uncle 19   Lung cancer Maternal Uncle 35   Alzheimer's disease Maternal Grandfather    Breast cancer Cousin 7       The patient has one daughter who is cancer free.  She has one sister and two brothers.  Her sister had genetic testing and a VUS in PMS2 was found.  Both parents are deceased.   The patient's mother was diagnosed with cervical cancer at 58 and breast cancer in her early 73's.  She had two brothers, one had throat and pancreatic cancer and the other had lung cancer.  One brother had a daughter with breast cancer at 61.  The maternal grandparents are deceased.   The patient's father died ina car accident.  He had 6-7 siblings who were cancer free.  His parents died of non-cancer related issues.   Anne Alexander is aware of previous family history of genetic testing for hereditary cancer risks  GENETIC TEST RESULTS: Genetic testing reported out on March 02, 2023 through the CancerNext-Expanded+RNAinsight cancer panel found no pathogenic mutations. The CancerNext-Expanded gene panel offered by [employer] and includes sequencing and rearrangement analysis for the following 71 genes: AIP, ALK, APC, ATM, BAP1, BARD1, BMPR1A, BRCA1, BRCA2, BRIP1, CDC73, CDH1, CDK4, CDKN1B, CDKN2A, CHEK2, DICER1, FH, FLCN, KIF1B, LZTR1, MAX, MEN1, MET, MLH1, MSH2, MSH6, MUTYH, NF1, NF2, NTHL1, PALB2, PHOX2B, PMS2, POT1, PRKAR1A, PTCH1, PTEN, RAD51C, RAD51D, RB1, RET, SDHA, SDHAF2, SDHB, SDHC, SDHD, SMAD4, SMARCA4, SMARCB1, SMARCE1, STK11, SUFU, TMEM127, TP53, TSC1, TSC2 and VHL (sequencing and deletion/duplication); AXIN2, CTNNA1, EGFR, EGLN1, HOXB13, KIT, MITF, MSH3, PDGFRA, POLD1 and POLE (sequencing only); EPCAM and GREM1 (deletion/duplication only). RNA data is routinely analyzed for use in variant interpretation for all genes.  The test report has been scanned into EPIC and is located under the Molecular Pathology section of the Results Review tab.  A portion of the result report is included below for reference.     We discussed with Anne Alexander that because current genetic testing is not perfect, it is possible there may be a gene mutation in one of these genes that current testing cannot detect, but that chance is small.  We also discussed, that there could be another gene that has not yet been discovered, or that we have not yet tested, that is responsible for the cancer diagnoses in the family. It is also possible there is a hereditary cause for the cancer in the family that Anne Alexander did not inherit and therefore was not identified in her testing.  Therefore, it is important to remain in touch with cancer genetics in the  future so that we can continue to offer Anne Alexander the most up to date genetic testing.   ADDITIONAL GENETIC TESTING: We discussed with Anne Alexander that her genetic testing was fairly extensive.  If there are genes identified to increase cancer risk that can be analyzed in the future, we would be happy to discuss and coordinate this testing at that time.    CANCER SCREENING  RECOMMENDATIONS: Anne Alexander test result is considered negative (normal).  This means that we have not identified a hereditary cause for her family history of cancer at this time. Most cancers happen by chance and this negative test suggests that her cancer may fall into this category.    While reassuring, this does not definitively rule out a hereditary predisposition to cancer. It is still possible that there could be genetic mutations that are undetectable by current technology. There could be genetic mutations in genes that have not been tested or identified to increase cancer risk.  Therefore, it is recommended she continue to follow the cancer management and screening guidelines provided by her primary healthcare provider.   An individual's cancer risk and medical management are not determined by genetic test results alone. Overall cancer risk assessment incorporates additional factors, including personal medical history, family history, and any available genetic information that may result in a personalized plan for cancer prevention and surveillance   RECOMMENDATIONS FOR FAMILY MEMBERS:  Individuals in this family might be at some increased risk of developing cancer, over the general population risk, simply due to the family history of cancer.  We recommended women in this family have a yearly mammogram beginning at age 8, or 37 years younger than the earliest onset of cancer, an annual clinical breast exam, and perform monthly breast self-exams. Women in this family should also have a gynecological exam as recommended by their primary provider. All family members should be referred for colonoscopy starting at age 33.  FOLLOW-UP: Lastly, we discussed with Anne Alexander that cancer genetics is a rapidly advancing field and it is possible that new genetic tests will be appropriate for her and/or her family members in the future. We encouraged her to remain in contact with cancer genetics on an  annual basis so we can update her personal and family histories and let her know of advances in cancer genetics that may benefit this family.   Our contact number was provided. Anne Alexander questions were answered to her satisfaction, and she knows she is welcome to call us at anytime with additional questions or concerns.   Maylon Cos, MS, [facility] Licensed, Certified Genetic Counselor [email or web address]

## 2023-04-10 ENCOUNTER — Encounter: Payer: Self-pay | Admitting: Family Medicine

## 2023-04-10 ENCOUNTER — Ambulatory Visit (INDEPENDENT_AMBULATORY_CARE_PROVIDER_SITE_OTHER): Payer: PPO | Admitting: Family Medicine

## 2023-04-10 VITALS — BP 134/72 | HR 60 | Ht 65.0 in | Wt 184.0 lb

## 2023-04-10 DIAGNOSIS — M25512 Pain in left shoulder: Secondary | ICD-10-CM

## 2023-04-10 DIAGNOSIS — Z0000 Encounter for general adult medical examination without abnormal findings: Secondary | ICD-10-CM

## 2023-04-10 DIAGNOSIS — E039 Hypothyroidism, unspecified: Secondary | ICD-10-CM

## 2023-04-10 LAB — CBC WITH DIFFERENTIAL/PLATELET
Basophils Absolute: 0 10*3/uL (ref 0.0–0.1)
Basophils Relative: 0.5 % (ref 0.0–3.0)
Eosinophils Absolute: 0.2 10*3/uL (ref 0.0–0.7)
Eosinophils Relative: 2.9 % (ref 0.0–5.0)
HCT: 43 % (ref 36.0–46.0)
Hemoglobin: 14.1 g/dL (ref 12.0–15.0)
Lymphocytes Relative: 19.3 % (ref 12.0–46.0)
Lymphs Abs: 1 10*3/uL (ref 0.7–4.0)
MCHC: 32.8 g/dL (ref 30.0–36.0)
MCV: 89.1 fl (ref 78.0–100.0)
Monocytes Absolute: 0.4 10*3/uL (ref 0.1–1.0)
Monocytes Relative: 7.3 % (ref 3.0–12.0)
Neutro Abs: 3.7 10*3/uL (ref 1.4–7.7)
Neutrophils Relative %: 70 % (ref 43.0–77.0)
Platelets: 301 10*3/uL (ref 150.0–400.0)
RBC: 4.83 Mil/uL (ref 3.87–5.11)
RDW: 13.2 % (ref 11.5–15.5)
WBC: 5.3 10*3/uL (ref 4.0–10.5)

## 2023-04-10 LAB — HEPATIC FUNCTION PANEL
ALT: 14 U/L (ref 0–35)
AST: 19 U/L (ref 0–37)
Albumin: 4.3 g/dL (ref 3.5–5.2)
Alkaline Phosphatase: 71 U/L (ref 39–117)
Bilirubin, Direct: 0.1 mg/dL (ref 0.0–0.3)
Total Bilirubin: 0.4 mg/dL (ref 0.2–1.2)
Total Protein: 7.3 g/dL (ref 6.0–8.3)

## 2023-04-10 LAB — BASIC METABOLIC PANEL
BUN: 16 mg/dL (ref 6–23)
CO2: 29 mEq/L (ref 19–32)
Calcium: 10.1 mg/dL (ref 8.4–10.5)
Chloride: 104 mEq/L (ref 96–112)
Creatinine, Ser: 0.86 mg/dL (ref 0.40–1.20)
GFR: 68.64 mL/min (ref >60.00)
Glucose, Bld: 92 mg/dL (ref 70–99)
Potassium: 5.1 mEq/L (ref 3.5–5.1)
Sodium: 139 mEq/L (ref 135–145)

## 2023-04-10 LAB — LIPID PANEL
Cholesterol: 240 mg/dL — ABNORMAL HIGH (ref 0–200)
HDL: 59.5 mg/dL (ref >39.00)
LDL Cholesterol: 146 mg/dL — ABNORMAL HIGH (ref 0–99)
NonHDL: 180.43
Total CHOL/HDL Ratio: 4
Triglycerides: 171 mg/dL — ABNORMAL HIGH (ref 0.0–149.0)
VLDL: 34.2 mg/dL (ref 0.0–40.0)

## 2023-04-10 LAB — TSH: TSH: 0.38 u[IU]/mL (ref 0.35–5.50)

## 2023-04-10 NOTE — Assessment & Plan Note (Signed)
Check labs and adjust meds prn. 

## 2023-04-10 NOTE — Progress Notes (Signed)
   Subjective:    Patient ID: Anne Alexander, female    DOB: 1951/07/02, 72 y.o.   MRN: [id number]  HPI CPE- UTD on mammo, colonoscopy.  Due for Tdap, PNA, Shingrix but pt indicates these were done elsewhere (not sure where)  Patient Care Team    Relationship Specialty Notifications Start End  Sheliah Hatch, MD PCP - General Family Medicine  10/07/21      Health Maintenance  Topic Date Due   Medicare Annual Wellness (AWV)  Never done   DTaP/Tdap/Td (1 - Tdap) Never done   Pneumonia Vaccine 27+ Years old (1 of 1 - PCV) Never done   Zoster Vaccines- Shingrix (2 of 2) 12/09/2020   INFLUENZA VACCINE  06/07/2023   MAMMOGRAM  05/31/2024   Colonoscopy  06/22/2030   DEXA SCAN  Completed   Hepatitis C Screening  Completed   HPV VACCINES  Aged Out   COVID-19 Vaccine  Discontinued      Review of Systems Patient reports no vision/ hearing changes, adenopathy,fever, weight change,  persistant/recurrent hoarseness , swallowing issues, chest pain, palpitations, edema, persistant/recurrent cough, hemoptysis, dyspnea (rest/exertional/paroxysmal nocturnal), gastrointestinal bleeding (melena, rectal bleeding), abdominal pain, significant heartburn, bowel changes, Gyn symptoms (abnormal  bleeding, pain),  syncope, focal weakness, memory loss, numbness & tingling, skin/hair/nail changes, abnormal bruising or bleeding, anxiety, or depression.   + L shoulder pain- pain w/ abduction and limited strength. + urinary frequency    Objective:   Physical Exam General Appearance:    Alert, cooperative, no distress, appears stated age  Head:    Normocephalic, without obvious abnormality, atraumatic  Eyes:    PERRL, conjunctiva/corneas clear, EOM's intact both eyes  Ears:    Normal TM's and external ear canals, both ears  Nose:   Nares normal, septum midline, mucosa normal, no drainage    or sinus tenderness  Throat:   Lips, mucosa, and tongue normal; teeth and gums normal  Neck:   Supple,  symmetrical, trachea midline, no adenopathy;    Thyroid: no enlargement/tenderness/nodules  Back:     Symmetric, no curvature, ROM normal, no CVA tenderness  Lungs:     Clear to auscultation bilaterally, respirations unlabored  Chest Wall:    No tenderness or deformity   Heart:    Regular rate and rhythm, S1 and S2 normal, no murmur, rub   or gallop  Breast Exam:    Deferred to mammo  Abdomen:     Soft, non-tender, bowel sounds active all four quadrants,    no masses, no organomegaly  Genitalia:    Deferred  Rectal:    Extremities:   Extremities normal, atraumatic, no cyanosis or edema  Pulses:   2+ and symmetric all extremities  Skin:   Skin color, texture, turgor normal, no rashes or lesions  Lymph nodes:   Cervical, supraclavicular, and axillary nodes normal  Neurologic:   CNII-XII intact, normal strength, sensation and reflexes    throughout          Assessment & Plan:

## 2023-04-10 NOTE — Assessment & Plan Note (Signed)
Pt's PE WNL w/ exception of BMI and L shoulder pain/weakness.  She is due for multiple vaccines according to our records but she states these have been done elsewhere- she just doesn't remember where.  Encouraged her to find that information.  UTD on mammo, colonoscopy.  Check labs.  Anticipatory guidance provided.

## 2023-04-10 NOTE — Patient Instructions (Signed)
Follow up in 1 year or as needed We'll notify you of your lab results and make any changes if needed We'll call you to schedule your Ortho appt for the shoulder pain Continue to work on healthy diet and regular exercise- you can do it! Try Melatonin 1 mg nightly ~1 hour prior to desired bed time Increase your water intake Call with any questions or concerns Stay Safe!  Stay Healthy! Have a great summer!!!

## 2023-04-11 ENCOUNTER — Telehealth: Payer: Self-pay

## 2023-04-11 ENCOUNTER — Other Ambulatory Visit: Payer: Self-pay

## 2023-04-11 DIAGNOSIS — E785 Hyperlipidemia, unspecified: Secondary | ICD-10-CM

## 2023-04-11 MED ORDER — ROSUVASTATIN CALCIUM 10 MG PO TABS
10.0000 mg | ORAL_TABLET | Freq: Every day | ORAL | 3 refills | Status: DC
Start: 2023-04-11 — End: 2023-07-08

## 2023-04-11 NOTE — Telephone Encounter (Signed)
-----   Message from Sheliah Hatch, MD sent at 04/11/2023  7:25 AM EDT ----- Total cholesterol and LDL (bad cholesterol) are both higher than last check.  Based on the fact that the LDL (bad) is climbing up near 150, I think it's time to start a low dose cholesterol medication.  We'll send in a prescription for Crestor 10mg  nightly (#30, 3 refills) and have you return in 6-8 weeks for a lab only visit to recheck your liver functions and make sure the medication is being metabolized appropriately (dx hyperlipidemia)  Remainder of labs look great!

## 2023-04-11 NOTE — Telephone Encounter (Signed)
Pt is aware of lab results . Sent in Crestor 10 mg and placed liver function test . Pt lab only visit has been made

## 2023-05-04 ENCOUNTER — Telehealth: Payer: Self-pay

## 2023-05-04 NOTE — Telephone Encounter (Signed)
Patient scheduled an appointment with Elmarie Shiley, NP for 2:00 on Monday for Tiffany to take a look at it. Routed to [facility] for review.

## 2023-05-04 NOTE — Telephone Encounter (Signed)
Patient called & left message on triage voicemail. Patient states that she has a growth in the vaginal area that has been there for a while. She states it was removed many years ago & has come back. She sees Tiffany, NP for her yearly exams and was told that they would just watch it & for her to let us know if it ever bothers her. She said it has just started to bother her.  She wears a minipad incase of urinary leakage& noticed a little bit of pink blood on it from the area. Patient is wondering if she needs to schedule an appointment to be seen or if she needs a referral.

## 2023-05-07 ENCOUNTER — Ambulatory Visit: Payer: PPO | Admitting: Nurse Practitioner

## 2023-05-07 ENCOUNTER — Other Ambulatory Visit (HOSPITAL_COMMUNITY)
Admission: RE | Admit: 2023-05-07 | Discharge: 2023-05-07 | Disposition: A | Discharge status: Home / Self Care | Payer: PPO | Source: Ambulatory Visit | Attending: Nurse Practitioner | Admitting: Nurse Practitioner

## 2023-05-07 ENCOUNTER — Encounter: Payer: Self-pay | Admitting: Nurse Practitioner

## 2023-05-07 VITALS — BP 124/70 | HR 78 | Wt 184.0 lb

## 2023-05-07 DIAGNOSIS — N764 Abscess of vulva: Secondary | ICD-10-CM | POA: Diagnosis not present

## 2023-05-07 MED ORDER — SULFAMETHOXAZOLE-TRIMETHOPRIM 800-160 MG PO TABS
1.0000 | ORAL_TABLET | Freq: Two times a day (BID) | ORAL | 0 refills | Status: AC
Start: 2023-05-07 — End: 2023-05-17

## 2023-05-07 NOTE — Progress Notes (Signed)
   Acute Office Visit  Subjective:    Patient ID: Anne Alexander, female    DOB: 10/04/1951, 72 y.o.   MRN: [id number]   HPI 72 y.o. presents today for vulvar bump. Has had small bump in this area for a long time but it has remained unchanged, likely a sebaceous cyst. It is now red, painful, larger and draining.   No LMP recorded. Patient has had a hysterectomy.    Review of Systems  Constitutional: Negative.   Genitourinary:  Positive for genital sores.       Objective:    Physical Exam Constitutional:      Appearance: Normal appearance.  Genitourinary:      BP 124/70   Pulse 78   Wt 184 lb (83.5 kg)   SpO2 100%   BMI 30.62 kg/m  Wt Readings from Last 3 Encounters:  05/07/23 184 lb (83.5 kg)  04/10/23 184 lb (83.5 kg)  12/19/22 185 lb (83.9 kg)        Patient informed chaperone available to be present for breast and/or pelvic exam. Patient has requested no chaperone to be present. Patient has been advised what will be completed during breast and pelvic exam.   Assessment & Plan:   Problem List Items Addressed This Visit   None Visit Diagnoses     Vulvar abscess    -  Primary   Relevant Medications   sulfamethoxazole-trimethoprim (BACTRIM DS) 800-160 MG tablet   Other Relevant Orders   WOUND CULTURE   Surgical pathology      Plan:   Discussed risks and benefits of the procedure, as well as the alternatives. Written consent obtained from patient.  Incision & drainage performed on vulvar abscess. The area was prepped, cleaned with Hibiclens, and locally anesthetized. #11 scalpel used to make surgical incision over the most fluctuant area. I then expressed any fluid that I could. Hard, white substance removed ~4 mm in size, able to remove a little more from opening as well. Small amount of bloody malodorous drainage. Area cleaned, 4x4s and pad provided for any leftover drainage. Return in 2 days for follow up. Bactrim 800-160 mg BID x 10 days. Wound  culture and pathology pending.       Olivia Mackie DNP, 2:42 PM 05/07/2023

## 2023-05-09 ENCOUNTER — Encounter: Payer: Self-pay | Admitting: Nurse Practitioner

## 2023-05-09 ENCOUNTER — Ambulatory Visit (INDEPENDENT_AMBULATORY_CARE_PROVIDER_SITE_OTHER): Payer: PPO | Admitting: Nurse Practitioner

## 2023-05-09 VITALS — BP 110/70 | HR 67 | Wt 185.0 lb

## 2023-05-09 DIAGNOSIS — N764 Abscess of vulva: Secondary | ICD-10-CM

## 2023-05-09 LAB — SURGICAL PATHOLOGY

## 2023-05-09 NOTE — Progress Notes (Signed)
   Acute Office Visit  Subjective:    Patient ID: MINDE GASH, female    DOB: 17-Nov-1950, 72 y.o.   MRN: [id number]   HPI 72 y.o. presents today for 2-day follow up for vulvar abscess. I&D performed 05/07/23, negative preliminary culture result. Pathology pending. Currently on 10-day Bactrim course. Much improvement. No drainage since shortly after procedure.   No LMP recorded. Patient has had a hysterectomy.    Review of Systems  Constitutional: Negative.   Genitourinary:  Positive for genital sores.       Objective:    Physical Exam Constitutional:      Appearance: Normal appearance.  Genitourinary:      BP 110/70   Pulse 67   Wt 185 lb (83.9 kg)   SpO2 100%   BMI 30.79 kg/m  Wt Readings from Last 3 Encounters:  05/09/23 185 lb (83.9 kg)  05/07/23 184 lb (83.5 kg)  04/10/23 184 lb (83.5 kg)        Patient informed chaperone available to be present for breast and/or pelvic exam. Patient has requested no chaperone to be present. Patient has been advised what will be completed during breast and pelvic exam.   Assessment & Plan:   Problem List Items Addressed This Visit   None Visit Diagnoses     Vulvar abscess    -  Primary      Plan: Much improvement. Finish antibiotic course. Return as needed.      Olivia Mackie DNP, 3:34 PM 05/09/2023

## 2023-05-11 LAB — WOUND CULTURE
MICRO NUMBER:: 15147313
RESULT:: NORMAL
SPECIMEN QUALITY:: ADEQUATE

## 2023-05-23 ENCOUNTER — Other Ambulatory Visit (INDEPENDENT_AMBULATORY_CARE_PROVIDER_SITE_OTHER): Payer: PPO

## 2023-05-23 DIAGNOSIS — E785 Hyperlipidemia, unspecified: Secondary | ICD-10-CM | POA: Diagnosis not present

## 2023-05-24 ENCOUNTER — Telehealth: Payer: Self-pay

## 2023-05-24 ENCOUNTER — Other Ambulatory Visit: Payer: Self-pay

## 2023-05-24 DIAGNOSIS — M25512 Pain in left shoulder: Secondary | ICD-10-CM

## 2023-05-24 LAB — HEPATIC FUNCTION PANEL
ALT: 17 U/L (ref 0–35)
AST: 22 U/L (ref 0–37)
Albumin: 4 g/dL (ref 3.5–5.2)
Alkaline Phosphatase: 83 U/L (ref 39–117)
Bilirubin, Direct: 0.1 mg/dL (ref 0.0–0.3)
Total Bilirubin: 0.4 mg/dL (ref 0.2–1.2)
Total Protein: 6.7 g/dL (ref 6.0–8.3)

## 2023-05-24 NOTE — Telephone Encounter (Signed)
Spoke to the pt and she states she has an apt w/ Dr August Saucer ortho July 31/2024. Advised pt to continue Crestor .  Pt expressed verbal understsnding

## 2023-05-24 NOTE — Telephone Encounter (Signed)
-----   Message from Neena Rhymes sent at 05/24/2023 12:33 PM EDT ----- Labs look great!  No changes at this time

## 2023-05-24 NOTE — Telephone Encounter (Signed)
Orthopedic referral has been placed and yes, she should continue Crestor

## 2023-06-06 ENCOUNTER — Other Ambulatory Visit (INDEPENDENT_AMBULATORY_CARE_PROVIDER_SITE_OTHER): Payer: PPO

## 2023-06-06 ENCOUNTER — Ambulatory Visit: Payer: PPO | Admitting: Orthopedic Surgery

## 2023-06-06 DIAGNOSIS — M25512 Pain in left shoulder: Secondary | ICD-10-CM | POA: Diagnosis not present

## 2023-06-06 DIAGNOSIS — M7552 Bursitis of left shoulder: Secondary | ICD-10-CM | POA: Diagnosis not present

## 2023-06-08 ENCOUNTER — Encounter: Payer: Self-pay | Admitting: Orthopedic Surgery

## 2023-06-08 MED ORDER — METHYLPREDNISOLONE ACETATE 40 MG/ML IJ SUSP
40.0000 mg | INTRAMUSCULAR | Status: AC | PRN
Start: 2023-06-06 — End: 2023-06-06
  Administered 2023-06-06: 40 mg via INTRA_ARTICULAR

## 2023-06-08 MED ORDER — BUPIVACAINE HCL 0.5 % IJ SOLN
9.0000 mL | INTRAMUSCULAR | Status: AC | PRN
Start: 2023-06-06 — End: 2023-06-06
  Administered 2023-06-06: 9 mL via INTRA_ARTICULAR

## 2023-06-08 MED ORDER — LIDOCAINE HCL 1 % IJ SOLN
5.0000 mL | INTRAMUSCULAR | Status: AC | PRN
Start: 2023-06-06 — End: 2023-06-06
  Administered 2023-06-06: 5 mL

## 2023-06-08 NOTE — Progress Notes (Signed)
Office Visit Note   Patient: Anne Alexander           Date of Birth: [birth year]           MRN: [id number] Visit Date: 06/06/2023 Requested by: Sheliah Hatch, MD [address] [place],  [place] [address] PCP: Sheliah Hatch, MD  Subjective: Chief Complaint  Patient presents with   Left Shoulder - Pain    HPI: Anne Alexander is a 72 y.o. female who presents to the office reporting left shoulder pain of several months duration.  Denies any history of injury.  Patient is right-hand dominant.  She has some radiation into the neck as well as down the arm but denies any numbness and tingling.  Hard for her to lay on that left-hand side.  Does wake her from sleep at night.  Describes decreased range of motion and difficulty with ADLs.  Pain increases with activity.  Patient also reports some right hip/buttock pain.  Pain comes and goes.  Pain increases with sitting and driving but she denies any groin pain or radicular symptoms.  Takes over-the-counter medication with some relief.  Does have a history of cervical fusion over 10 years ago.  She retired in December and has been doing more work outside including gardening work but has not been able to do anything for 2 months.  7 minutes of driving irritates the left shoulder.  The pain does radiate into the distal and dorsal hand..                ROS: All systems reviewed are negative as they relate to the chief complaint within the history of present illness.  Patient denies fevers or chills.  Assessment & Plan: Visit Diagnoses:  1. Left shoulder pain, unspecified chronicity     Plan: Impression is left shoulder pain which looks like bursitis.  Does not really look frozen at this time.  Rotator cuff strength intact.  We will try a subacromial injection with 4-week return with decision for or against further imaging of the neck and/or shoulder at that time based on response to injection.  Follow-Up Instructions: No  follow-ups on file.   Orders:  Orders Placed This Encounter  Procedures   XR Shoulder Left   No orders of the defined types were placed in this encounter.     Procedures: Large Joint Inj: L subacromial bursa on 06/06/2023 11:14 PM Indications: diagnostic evaluation and pain Details: 18 G 1.5 in needle, posterior approach  Arthrogram: No  Medications: 9 mL bupivacaine 0.5 %; 40 mg methylPREDNISolone acetate 40 MG/ML; 5 mL lidocaine 1 % Outcome: tolerated well, no immediate complications Procedure, treatment alternatives, risks and benefits explained, specific risks discussed. Consent was given by the patient. Immediately prior to procedure a time out was called to verify the correct patient, procedure, equipment, support staff and site/side marked as required. Patient was prepped and draped in the usual sterile fashion.       Clinical Data: No additional findings.  Objective: Vital Signs: There were no vitals taken for this visit.  Physical Exam:  Constitutional: Patient appears well-developed HEENT:  Head: Normocephalic Eyes:EOM are normal Neck: Normal range of motion Cardiovascular: Normal rate Pulmonary/chest: Effort normal Neurologic: Patient is alert Skin: Skin is warm Psychiatric: Patient has normal mood and affect  Ortho Exam: Ortho exam demonstrates bilateral shoulder range of motion passively of 50/110/165.  No coarseness on the left with internal/external rotation at 90 degrees of abduction.  No discrete AC joint tenderness.  Neck range of motion reasonable with 30 of extension flexion chin to chest and rotation about 60 degrees.  Patient has intact motor or sensory function of bilateral upper extremities with palpable radial pulses.   Specialty Comments:  No specialty comments available.  Imaging: No results found.   PMFS History: Patient Active Problem List   Diagnosis Date Noted   Genetic testing 03/02/2023   Family history of breast cancer  02/07/2023   Physical exam 04/07/2022   Hypothyroidism 10/07/2021   Hot flashes 10/07/2021   Insomnia 10/07/2021   Hyperlipidemia 10/07/2021   Urinary frequency 10/07/2021   Memory change    Past Medical History:  Diagnosis Date   Allergy    Cataract    forming very small    COVID 07/27/2020   felt bad fever fatigue loss of taste and smell x 10 days infusion given   Eczema    GERD (gastroesophageal reflux disease)    High cholesterol    History of hiatal hernia    Hypothyroidism    Memory change    Osteopenia 06/2018   T score -2.1 FRAX 14% / 0.3%   Rosacea    Sleep deficient    SUI (stress urinary incontinence, female)    Vaginal prolapse    Wears glasses     Family History  Problem Relation Age of Onset   Dementia Mother    Breast cancer Mother 50       bilateral   Cervical cancer Mother 79   Heart Problems Sister    Dementia Brother    Throat cancer Maternal Uncle 54   Lung cancer Maternal Uncle 4   Alzheimer's disease Maternal Grandfather    Breast cancer Cousin 5    Past Surgical History:  Procedure Laterality Date   ANTERIOR AND POSTERIOR REPAIR WITH SACROSPINOUS FIXATION N/A 01/03/2021   Procedure: SACROSPINOUS LIGAMENT FIXATION;  Surgeon: Marguerita Beards, MD;  Location: [facility];  Service: Gynecology;  Laterality: N/A;  total time needed for all 4 procedures is 2 hours   BLADDER SUSPENSION  yrs ago   Raz bladder suspension   BLADDER SUSPENSION N/A 01/03/2021   Procedure: TRANSVAGINAL TAPE (TVT) PROCEDURE;  Surgeon: Marguerita Beards, MD;  Location: [facility];  Service: Gynecology;  Laterality: N/A;   CATARACT EXTRACTION     colonscopy  2021   CYSTOSCOPY N/A 01/03/2021   Procedure: CYSTOSCOPY;  Surgeon: Marguerita Beards, MD;  Location: [facility];  Service: Gynecology;  Laterality: N/A;   ingrown toenail removed  yrs ago   NECK SURGERY  6-7 yrs ago   cervical has plates and screws dr  Jule Ser   OOPHORECTOMY Bilateral age 36   endometriosis   RECTOCELE REPAIR  01/03/2021   Procedure: POSTERIOR REPAIR (RECTOCELE);  Surgeon: Marguerita Beards, MD;  Location: [facility];  Service: Gynecology;;   TONSILLECTOMY  as child   VAGINAL HYSTERECTOMY  age 58   partial then complete   Social History   Occupational History    Employer: [employer]  Tobacco Use   Smoking status: Former    Current packs/day: 0.00    Average packs/day: 1 pack/day for 20.0 years (20.0 ttl pk-yrs)    Types: Cigarettes    Start date: 03/24/1986    Quit date: 03/24/2006    Years since quitting: 17.2   Smokeless tobacco: Never  Vaping Use   Vaping status: Never Used  Substance and  Sexual Activity   Alcohol use: No   Drug use: No   Sexual activity: Not Currently    Comment: Pt. declined sexual hx questions

## 2023-07-04 ENCOUNTER — Ambulatory Visit: Payer: PPO | Admitting: Orthopedic Surgery

## 2023-07-04 ENCOUNTER — Encounter: Payer: Self-pay | Admitting: Orthopedic Surgery

## 2023-07-04 DIAGNOSIS — M7552 Bursitis of left shoulder: Secondary | ICD-10-CM | POA: Diagnosis not present

## 2023-07-04 NOTE — Progress Notes (Unsigned)
Office Visit Note   Patient: Anne Alexander           Date of Birth: 09-17-1951           MRN: [id number] Visit Date: 07/04/2023 Requested by: Sheliah Hatch, MD [address] [place],  [place] [address] PCP: Sheliah Hatch, MD  Subjective: Chief Complaint  Patient presents with   Left Shoulder - Follow-up    HPI: Anne Alexander is a 72 y.o. female who presents to the office reporting ***.                ROS: All systems reviewed are negative as they relate to the chief complaint within the history of present illness.  Patient denies fevers or chills.  Assessment & Plan: Visit Diagnoses:  1. Bursitis of left shoulder     Plan: ***  Follow-Up Instructions: No follow-ups on file.   Orders:  No orders of the defined types were placed in this encounter.  No orders of the defined types were placed in this encounter.     Procedures: No procedures performed   Clinical Data: No additional findings.  Objective: Vital Signs: There were no vitals taken for this visit.  Physical Exam:  Constitutional: Patient appears well-developed HEENT:  Head: Normocephalic Eyes:EOM are normal Neck: Normal range of motion Cardiovascular: Normal rate Pulmonary/chest: Effort normal Neurologic: Patient is alert Skin: Skin is warm Psychiatric: Patient has normal mood and affect  Ortho Exam: ***  Specialty Comments:  No specialty comments available.  Imaging: No results found.   PMFS History: Patient Active Problem List   Diagnosis Date Noted   Genetic testing 03/02/2023   Family history of breast cancer 02/07/2023   Physical exam 04/07/2022   Hypothyroidism 10/07/2021   Hot flashes 10/07/2021   Insomnia 10/07/2021   Hyperlipidemia 10/07/2021   Urinary frequency 10/07/2021   Memory change    Past Medical History:  Diagnosis Date   Allergy    Cataract    forming very small    COVID 07/27/2020   felt bad fever fatigue loss of taste and smell x  10 days infusion given   Eczema    GERD (gastroesophageal reflux disease)    High cholesterol    History of hiatal hernia    Hypothyroidism    Memory change    Osteopenia 06/2018   T score -2.1 FRAX 14% / 0.3%   Rosacea    Sleep deficient    SUI (stress urinary incontinence, female)    Vaginal prolapse    Wears glasses     Family History  Problem Relation Age of Onset   Dementia Mother    Breast cancer Mother 84       bilateral   Cervical cancer Mother 50   Heart Problems Sister    Dementia Brother    Throat cancer Maternal Uncle 21   Lung cancer Maternal Uncle 82   Alzheimer's disease Maternal Grandfather    Breast cancer Cousin 61    Past Surgical History:  Procedure Laterality Date   ANTERIOR AND POSTERIOR REPAIR WITH SACROSPINOUS FIXATION N/A 01/03/2021   Procedure: SACROSPINOUS LIGAMENT FIXATION;  Surgeon: Marguerita Beards, MD;  Location: [facility];  Service: Gynecology;  Laterality: N/A;  total time needed for all 4 procedures is 2 hours   BLADDER SUSPENSION  yrs ago   Raz bladder suspension   BLADDER SUSPENSION N/A 01/03/2021   Procedure: TRANSVAGINAL TAPE (TVT) PROCEDURE;  Surgeon: Florian Buff,  Rochel Brome, MD;  Location: [facility];  Service: Gynecology;  Laterality: N/A;   CATARACT EXTRACTION     colonscopy  2021   CYSTOSCOPY N/A 01/03/2021   Procedure: CYSTOSCOPY;  Surgeon: Marguerita Beards, MD;  Location: [facility];  Service: Gynecology;  Laterality: N/A;   ingrown toenail removed  yrs ago   NECK SURGERY  6-7 yrs ago   cervical has plates and screws dr Jule Ser   OOPHORECTOMY Bilateral age 42   endometriosis   RECTOCELE REPAIR  01/03/2021   Procedure: POSTERIOR REPAIR (RECTOCELE);  Surgeon: Marguerita Beards, MD;  Location: [facility];  Service: Gynecology;;   TONSILLECTOMY  as child   VAGINAL HYSTERECTOMY  age 84   partial then complete   Social History   Occupational History     Employer: [employer]  Tobacco Use   Smoking status: Former    Current packs/day: 0.00    Average packs/day: 1 pack/day for 20.0 years (20.0 ttl pk-yrs)    Types: Cigarettes    Start date: 03/24/1986    Quit date: 03/24/2006    Years since quitting: 17.2   Smokeless tobacco: Never  Vaping Use   Vaping status: Never Used  Substance and Sexual Activity   Alcohol use: No   Drug use: No   Sexual activity: Not Currently    Comment: Pt. declined sexual hx questions

## 2023-07-07 ENCOUNTER — Other Ambulatory Visit: Payer: Self-pay | Admitting: Family Medicine

## 2023-07-07 DIAGNOSIS — E785 Hyperlipidemia, unspecified: Secondary | ICD-10-CM

## 2023-09-10 DIAGNOSIS — H43811 Vitreous degeneration, right eye: Secondary | ICD-10-CM | POA: Diagnosis not present

## 2023-09-10 DIAGNOSIS — Z961 Presence of intraocular lens: Secondary | ICD-10-CM | POA: Diagnosis not present

## 2023-09-10 DIAGNOSIS — H5203 Hypermetropia, bilateral: Secondary | ICD-10-CM | POA: Diagnosis not present

## 2023-09-14 ENCOUNTER — Ambulatory Visit: Payer: PPO | Admitting: Family Medicine

## 2023-09-14 ENCOUNTER — Encounter: Payer: Self-pay | Admitting: Family Medicine

## 2023-09-14 VITALS — BP 136/78 | HR 66 | Temp 99.5°F

## 2023-09-14 DIAGNOSIS — H66001 Acute suppurative otitis media without spontaneous rupture of ear drum, right ear: Secondary | ICD-10-CM

## 2023-09-14 MED ORDER — GUAIFENESIN-CODEINE 100-10 MG/5ML PO SYRP
10.0000 mL | ORAL_SOLUTION | Freq: Three times a day (TID) | ORAL | 0 refills | Status: DC | PRN
Start: 2023-09-14 — End: 2024-04-14

## 2023-09-14 MED ORDER — AZITHROMYCIN 250 MG PO TABS: ORAL_TABLET | ORAL | 0 refills | Status: DC

## 2023-09-14 NOTE — Progress Notes (Signed)
   Subjective:    Patient ID: Anne Alexander, female    DOB: [birth year], 72 y.o.   MRN: [id number]  HPI Sore throat- sxs started early Tuesday morning.  Severe enough to wake her up.  Now w/ congestion, cough.  Denies HA.  Denies sinus pain/pressure.  Bilateral ear fullness.  Tm this AM 101.1  Responded to Tylenol.  Cough is dry and painful.  No known sick contacts.   Review of Systems For ROS see HPI     Objective:   Physical Exam Vitals reviewed.  Constitutional:      General: She is not in acute distress.    Appearance: She is well-developed. She is not ill-appearing.  HENT:     Head: Normocephalic and atraumatic.     Right Ear: Ear canal normal. A middle ear effusion is present. Tympanic membrane is erythematous.     Left Ear: Tympanic membrane and ear canal normal.     Nose: Congestion present. No rhinorrhea.     Comments: No TTP over frontal or maxillary sinuses    Mouth/Throat:     Mouth: Mucous membranes are moist. No oral lesions.     Pharynx: No oropharyngeal exudate or posterior oropharyngeal erythema.  Eyes:     Conjunctiva/sclera: Conjunctivae normal.  Pulmonary:     Effort: Pulmonary effort is normal. No respiratory distress.     Breath sounds: Normal breath sounds. No wheezing or rhonchi.     Comments: + dry cough Lymphadenopathy:     Cervical: Cervical adenopathy (R sided) present.  Skin:    General: Skin is warm and dry.  Neurological:     General: No focal deficit present.     Mental Status: She is alert and oriented to person, place, and time.  Psychiatric:        Mood and Affect: Mood normal.        Behavior: Behavior normal.           Assessment & Plan:  R OM- new.  Start Zpack due to PCN allergy.  Reviewed supportive care and red flags that should prompt return. Pt expressed understanding and is in agreement w/ plan.

## 2023-09-14 NOTE — Patient Instructions (Signed)
Follow up as needed or as scheduled START the Zpack as directed Drink LOTS of fluids Tylenol or ibuprofen for sore throat and fever REST! Use the cough syrup as needed Call with any questions or concerns Hang in there!!!

## 2023-10-17 ENCOUNTER — Telehealth: Payer: Self-pay

## 2023-10-17 NOTE — Telephone Encounter (Signed)
Called to schedule patient for a Welcome to Medicare visit prior to 11/2023 no answer at the time of call provided our call back number

## 2023-11-01 DIAGNOSIS — L720 Epidermal cyst: Secondary | ICD-10-CM | POA: Diagnosis not present

## 2023-11-01 DIAGNOSIS — D2239 Melanocytic nevi of other parts of face: Secondary | ICD-10-CM | POA: Diagnosis not present

## 2023-11-01 DIAGNOSIS — L821 Other seborrheic keratosis: Secondary | ICD-10-CM | POA: Diagnosis not present

## 2023-11-12 ENCOUNTER — Encounter: Payer: Self-pay | Admitting: Family Medicine

## 2023-11-12 ENCOUNTER — Telehealth: Payer: Self-pay

## 2023-11-12 ENCOUNTER — Ambulatory Visit (INDEPENDENT_AMBULATORY_CARE_PROVIDER_SITE_OTHER): Payer: PPO | Admitting: Family Medicine

## 2023-11-12 VITALS — BP 120/68 | HR 78 | Temp 98.7°F | Wt 181.5 lb

## 2023-11-12 DIAGNOSIS — R8290 Unspecified abnormal findings in urine: Secondary | ICD-10-CM | POA: Diagnosis not present

## 2023-11-12 DIAGNOSIS — H938X2 Other specified disorders of left ear: Secondary | ICD-10-CM | POA: Diagnosis not present

## 2023-11-12 LAB — POCT URINALYSIS DIPSTICK
Bilirubin, UA: NEGATIVE
Blood, UA: NEGATIVE
Glucose, UA: NEGATIVE
Ketones, UA: NEGATIVE
Leukocytes, UA: NEGATIVE
Nitrite, UA: NEGATIVE
Protein, UA: NEGATIVE
Spec Grav, UA: <=1.005 — AB (ref 1.010–1.025)
Urobilinogen, UA: 0.2 U/dL
pH, UA: 6 (ref 5.0–8.0)

## 2023-11-12 MED ORDER — CETIRIZINE HCL 10 MG PO TABS: 10.0000 mg | ORAL_TABLET | Freq: Every day | ORAL | 11 refills | Status: AC

## 2023-11-12 MED ORDER — MOMETASONE FUROATE 50 MCG/ACT NA SUSP: 2.0000 | Freq: Every day | NASAL | 12 refills | Status: DC

## 2023-11-12 NOTE — Telephone Encounter (Signed)
 Pt has been notified.

## 2023-11-12 NOTE — Patient Instructions (Signed)
 Follow up as needed or as scheduled START the daily Zyrtec  (Cetirizine ) until feeling better USE the Nasonex - 2 sprays each nostril daily- to open up the passage between the nose and the ear Continue to drink LOTS of fluids Call with any questions or concerns Hang in there!

## 2023-11-12 NOTE — Telephone Encounter (Signed)
-----   Message from Neena Rhymes sent at 11/12/2023 10:39 AM EST ----- Your urine looks great!  No evidence or concern for infection

## 2023-11-12 NOTE — Progress Notes (Signed)
   Subjective:    Patient ID: Anne Alexander, female    DOB: 1951/01/09, 73 y.o.   MRN: [id number]  HPI Ear pain- sxs started Friday w/ L sided ear pressure.  Notes that if she turns her head, blows her nose, or leans forward she has pain/pressure.  No fever.  Denies sinus pain/pressure.  Had one occasion of feeling 'off balance'.  Saturday reports some confusion- inability to spell or concentrate.  No slurred speech.  No facial droop or focal weakness.  Urine odor- sxs started ~3-4 weeks ago.  No pain.  No change in urinary frequency or urgency.   Review of Systems For ROS see HPI     Objective:   Physical Exam Vitals reviewed.  Constitutional:      General: She is not in acute distress.    Appearance: Normal appearance. She is not ill-appearing.  HENT:     Head: Normocephalic and atraumatic.     Right Ear: Tympanic membrane and ear canal normal.     Left Ear: Tympanic membrane and ear canal normal.     Nose: No congestion or rhinorrhea.     Comments: No TTP over frontal sinuses, mild TTP over L maxillary sinus    Mouth/Throat:     Mouth: Mucous membranes are moist.     Pharynx: No oropharyngeal exudate or posterior oropharyngeal erythema.  Cardiovascular:     Rate and Rhythm: Normal rate and regular rhythm.  Pulmonary:     Effort: Pulmonary effort is normal. No respiratory distress.     Breath sounds: No wheezing or rhonchi.  Musculoskeletal:     Cervical back: Neck supple.  Lymphadenopathy:     Cervical: No cervical adenopathy.  Skin:    General: Skin is warm and dry.  Neurological:     General: No focal deficit present.     Mental Status: She is alert and oriented to person, place, and time.  Psychiatric:        Mood and Affect: Mood normal.        Behavior: Behavior normal.        Thought Content: Thought content normal.           Assessment & Plan:  L ear fullness- new.  Pt w/o evidence of infxn on exam.  Suspect she has eustachian tube dysfxn as she  only has discomfort w/ certain movements.  Likely sinus inflammation as well.  Whatever confusion she had on Saturday has resolved- and thankfully no other TIA/stroke sxs.  Start daily antihistamine and add nasal steroid.  Reviewed supportive care and red flags that should prompt return.  Pt expressed understanding and is in agreement w/ plan.   Urine odor- new.  Pt reports this started ~1 month ago.  Denies dysuria, hematuria, or change in frequency.  UA WNL.  No need for abx at this time.

## 2023-11-13 ENCOUNTER — Ambulatory Visit: Payer: PPO | Admitting: *Deleted

## 2023-11-13 DIAGNOSIS — Z0000 Encounter for general adult medical examination without abnormal findings: Secondary | ICD-10-CM

## 2023-11-13 NOTE — Progress Notes (Signed)
 Subjective:   Anne Alexander is a 73 y.o. female who presents for an Initial Medicare Annual Wellness Visit.  Visit Complete: Virtual I connected with  Rojelio CHRISTELLA Schlossman on 11/13/23 by a audio enabled telemedicine application and verified that I am speaking with the correct person using two identifiers.  Patient Location: Home  Provider Location: Home Office  I discussed the limitations of evaluation and management by telemedicine. The patient expressed understanding and agreed to proceed.  Vital Signs: Because this visit was a virtual/telehealth visit, some criteria may be missing or patient reported. Any vitals not documented were not able to be obtained and vitals that have been documented are patient reported.  Patient unable to connect to internet for video visit  Patient Medicare AWV questionnaire was completed by the patient on 11-12-2023; I have confirmed that all information answered by patient is correct and no changes since this date.  Cardiac Risk Factors include: advanced age (>8men, >55 women);sedentary lifestyle;obesity (BMI >30kg/m2)     Objective:    There were no vitals filed for this visit. There is no height or weight on file to calculate BMI.     11/13/2023    8:14 AM 06/26/2022    2:38 PM 01/03/2021    6:15 AM 10/18/2018    8:56 AM 09/16/2018    6:30 PM 01/29/2017    4:11 PM  Advanced Directives  Does Patient Have a Medical Advance Directive? No No No No No No  Would patient like information on creating a medical advance directive? No - Patient declined No - Patient declined Yes (MAU/Ambulatory/Procedural Areas - Information given) No - Patient declined Yes (ED - Information included in AVS) No - Patient declined    Current Medications (verified) Outpatient Encounter Medications as of 11/13/2023  Medication Sig   acetaminophen  (TYLENOL ) 325 MG tablet Take 650 mg by mouth as needed for mild pain (pain score 1-3) or moderate pain (pain score 4-6).    AREXVY 120 MCG/0.5ML injection    cetirizine  (ZYRTEC ) 10 MG tablet Take 1 tablet (10 mg total) by mouth daily.   diphenhydrAMINE  HCl, Sleep, (ZZZQUIL) 50 MG/30ML LIQD Take 30 mLs by mouth as needed.   FLUZONE HIGH-DOSE 0.5 ML injection    guaiFENesin -codeine  (ROBITUSSIN AC) 100-10 MG/5ML syrup Take 10 mLs by mouth 3 (three) times daily as needed for cough.   levothyroxine  (SYNTHROID ) 50 MCG tablet Take 1 tablet (50 mcg total) by mouth daily.   mometasone  (NASONEX ) 50 MCG/ACT nasal spray Place 2 sprays into the nose daily.   polyethylene glycol (MIRALAX ) 0.34 gm/ml SOLN    rosuvastatin  (CRESTOR ) 10 MG tablet TAKE 1 TABLET BY MOUTH EVERY DAY   azithromycin  (ZITHROMAX ) 250 MG tablet 2 tabs on day 1, 1 tab on day 2-5 (Patient not taking: Reported on 11/13/2023)   No facility-administered encounter medications on file as of 11/13/2023.    Allergies (verified) Penicillins   History: Past Medical History:  Diagnosis Date   Allergy    Cataract    forming very small    COVID 07/27/2020   felt bad fever fatigue loss of taste and smell x 10 days infusion given   Eczema    GERD (gastroesophageal reflux disease)    High cholesterol    History of hiatal hernia    Hypothyroidism    Memory change    Osteopenia 06/2018   T score -2.1 FRAX 14% / 0.3%   Rosacea    Sleep deficient    SUI (stress urinary  incontinence, female)    Vaginal prolapse    Wears glasses    Past Surgical History:  Procedure Laterality Date   ANTERIOR AND POSTERIOR REPAIR WITH SACROSPINOUS FIXATION N/A 01/03/2021   Procedure: SACROSPINOUS LIGAMENT FIXATION;  Surgeon: Marilynne Rosaline SAILOR, MD;  Location[facility];  Service: Gynecology;  Laterality: N/A;  total time needed for all 4 procedures is 2 hours   BLADDER SUSPENSION  yrs ago   Raz bladder suspension   BLADDER SUSPENSION N/A 01/03/2021   Procedure: TRANSVAGINAL TAPE (TVT) PROCEDURE;  Surgeon: Marilynne Rosaline SAILOR, MD;  Location: [facility];  Service: Gynecology;  Laterality: N/A;   CATARACT EXTRACTION     colonscopy  2021   CYSTOSCOPY N/A 01/03/2021   Procedure: CYSTOSCOPY;  Surgeon: Marilynne Rosaline SAILOR, MD;  Location: [facility];  Service: Gynecology;  Laterality: N/A;   ingrown toenail removed  yrs ago   NECK SURGERY  6-7 yrs ago   cervical has plates and screws dr mora   OOPHORECTOMY Bilateral age 38   endometriosis   RECTOCELE REPAIR  01/03/2021   Procedure: POSTERIOR REPAIR (RECTOCELE);  Surgeon: Marilynne Rosaline SAILOR, MD;  Location: [facility];  Service: Gynecology;;   TONSILLECTOMY  as child   VAGINAL HYSTERECTOMY  age 21   partial then complete   Family History  Problem Relation Age of Onset   Dementia Mother    Breast cancer Mother 52       bilateral   Cervical cancer Mother 55   Heart Problems Sister    Dementia Brother    Throat cancer Maternal Uncle 31   Lung cancer Maternal Uncle 38   Alzheimer's disease Maternal Grandfather    Breast cancer Cousin 56   Social History   Socioeconomic History   Marital status: Married    Spouse name: Jama   Number of children: 1   Years of education: 13   Highest education level: Not on file  Occupational History    Employer: [employer]  Tobacco Use   Smoking status: Former    Current packs/day: 0.00    Average packs/day: 1 pack/day for 20.0 years (20.0 ttl pk-yrs)    Types: Cigarettes    Start date: 03/24/1986    Quit date: 03/24/2006    Years since quitting: 17.6   Smokeless tobacco: Never  Vaping Use   Vaping status: Never Used  Substance and Sexual Activity   Alcohol use: No   Drug use: No   Sexual activity: Not Currently    Comment: Pt. declined sexual hx questions  Other Topics Concern   Not on file  Social History Narrative   Patient lives at home with her husband and works full time at [employer].    Education one year of college.   Drinks about 2 cups of coffee a day, and 1 soda  a day    Social Drivers of Corporate Investment Banker Strain: Low Risk  (11/13/2023)   Overall Financial Resource Strain (CARDIA)    Difficulty of Paying Living Expenses: Not hard at all  Food Insecurity: No Food Insecurity (11/13/2023)   Hunger Vital Sign    Worried About Running Out of Food in the Last Year: Never true    Ran Out of Food in the Last Year: Never true  Transportation Needs: No Transportation Needs (11/13/2023)   PRAPARE - Administrator, Civil Service (Medical): No    Lack of Transportation (  Non-Medical): No  Physical Activity: Inactive (11/13/2023)   Exercise Vital Sign    Days of Exercise per Week: 0 days    Minutes of Exercise per Session: 0 min  Stress: No Stress Concern Present (11/13/2023)   [employer] of Occupational Health - Occupational Stress Questionnaire    Feeling of Stress : Not at all  Social Connections: Socially Integrated (11/13/2023)   Social Connection and Isolation Panel [NHANES]    Frequency of Communication with Friends and Family: More than three times a week    Frequency of Social Gatherings with Friends and Family: Three times a week    Attends Religious Services: More than 4 times per year    Active Member of Clubs or Organizations: Yes    Attends Engineer, Structural: More than 4 times per year    Marital Status: Married    Tobacco Counseling Counseling given: Not Answered   Clinical Intake:  Pre-visit preparation completed: Yes  Pain : No/denies pain     Diabetes: No  How often do you need to have someone help you when you read instructions, pamphlets, or other written materials from your doctor or pharmacy?: 1 - Never  Interpreter Needed?: No  Information entered by :: Mliss Graff LPN   Activities of Daily Living    11/13/2023    8:21 AM  In your present state of health, do you have any difficulty performing the following activities:  Hearing? 0  Vision? 0  Difficulty concentrating or making  decisions? 0  Walking or climbing stairs? 0  Dressing or bathing? 0  Doing errands, shopping? 0  Preparing Food and eating ? N  Using the Toilet? N  In the past six months, have you accidently leaked urine? Y  Do you have problems with loss of bowel control? N  Managing your Medications? N  Managing your Finances? N  Housekeeping or managing your Housekeeping? N    Patient Care Team: Tabori, Katherine E, MD as PCP - General (Family Medicine)  Indicate any recent Medical Services you may have received from other than [facility] providers in the past year (date may be approximate).     Assessment:   This is a routine wellness examination for Conrad.  Hearing/Vision screen Hearing Screening - Comments:: No trouble hearing Vision Screening - Comments:: Up to date  [facility]   Goals Addressed             This Visit's Progress    Weight (lb) < 200 lb (90.7 kg)         Depression Screen    11/13/2023    8:18 AM 11/12/2023   10:02 AM 04/10/2023    7:59 AM 12/15/2022    8:21 AM 09/12/2022    8:16 AM 04/07/2022   12:46 PM 10/07/2021   11:38 AM  PHQ 2/9 Scores  PHQ - 2 Score 0 0 0 0 0 0 0  PHQ- 9 Score 3 5 5   0 8 5    Fall Risk    11/13/2023    8:14 AM 11/12/2023    9:49 AM 04/10/2023    7:55 AM 12/19/2022    1:34 PM 12/15/2022    8:13 AM  Fall Risk   Falls in the past year? 0 0 0 0 0  Number falls in past yr: 0 0 0 0 0  Injury with Fall? 0 0 0 0 0  Risk for fall due to :  No Fall Risks No Fall Risks  No Fall  Risks  Follow up Falls evaluation completed;Education provided;Falls prevention discussed Falls evaluation completed Falls evaluation completed Falls evaluation completed Falls evaluation completed    MEDICARE RISK AT HOME: Medicare Risk at Home Any stairs in or around the home?: Yes If so, are there any without handrails?: No Home free of loose throw rugs in walkways, pet beds, electrical cords, etc?: Yes Adequate lighting in your home to reduce risk of falls?: Yes Life alert?:  No Use of a cane, walker or w/c?: No Grab bars in the bathroom?: No Shower chair or bench in shower?: No Elevated toilet seat or a handicapped toilet?: No  TIMED UP AND GO:  Was the test performed? No    Cognitive Function:        11/13/2023    8:20 AM  6CIT Screen  What Year? 0 points  What month? 0 points  What time? 0 points  Count back from 20 0 points  Months in reverse 0 points  Repeat phrase 0 points  Total Score 0 points    Immunizations Immunization History  Administered Date(s) Administered   Fluad Quad(high Dose 65+) 08/19/2020   Influenza, High Dose Seasonal PF 08/16/2018, 08/09/2023   Influenza-Unspecified 08/06/2021, 07/21/2022   Moderna Covid-19 Vaccine Bivalent Booster 69yrs & up 07/04/2023   PFIZER(Purple Top)SARS-COV-2 Vaccination 11/27/2019, 12/15/2019, 07/24/2020, 02/04/2021, 07/21/2022   Pfizer Covid-19 Vaccine Bivalent Booster 21yrs & up 08/15/2021   Respiratory Syncytial Virus Vaccine,Recomb Aduvanted(Arexvy) 08/09/2023   Zoster Recombinant(Shingrix) 10/14/2020    TDAP status: Due, Education has been provided regarding the importance of this vaccine. Advised may receive this vaccine at local pharmacy or Health Dept. Aware to provide a copy of the vaccination record if obtained from local pharmacy or Health Dept. Verbalized acceptance and understanding.  Flu Vaccine status: Up to date  Pneumococcal vaccine status: Due, Education has been provided regarding the importance of this vaccine. Advised may receive this vaccine at local pharmacy or Health Dept. Aware to provide a copy of the vaccination record if obtained from local pharmacy or Health Dept. Verbalized acceptance and understanding.  Covid-19 vaccine status: Information provided on how to obtain vaccines.   Qualifies for Shingles Vaccine? Yes   Zostavax completed No   Shingrix Completed?: No.    Education has been provided regarding the importance of this vaccine. Patient has been advised to  call insurance company to determine out of pocket expense if they have not yet received this vaccine. Advised may also receive vaccine at local pharmacy or Health Dept. Verbalized acceptance and understanding.  Screening Tests Health Maintenance  Topic Date Due   Pneumonia Vaccine 58+ Years old (1 of 1 - PCV) 11/14/2023 (Originally 03/31/2018)   Zoster Vaccines- Shingrix (2 of 2) 11/14/2023 (Originally 12/09/2020)   COVID-19 Vaccine (8 - 2024-25 season) 11/29/2023 (Originally 08/29/2023)   DTaP/Tdap/Td (1 - Tdap) 11/12/2024 (Originally 03/31/1970)   MAMMOGRAM  05/31/2024   Medicare Annual Wellness (AWV)  11/12/2024   Colonoscopy  06/22/2030   INFLUENZA VACCINE  Completed   DEXA SCAN  Completed   Hepatitis C Screening  Completed   HPV VACCINES  Aged Out    Health Maintenance  There are no preventive care reminders to display for this patient.   Colorectal cancer screening: No longer required.   Mammogram status: Completed  . Repeat every year  Bone Density status: Completed 2024. Results reflect: Bone density results: OSTEOPENIA. Repeat every 2 years.  Lung Cancer Screening: (Low Dose CT Chest recommended if Age 11-80 years, 20 pack-year currently  smoking OR have quit w/in 15years.) does not qualify.   Lung Cancer Screening Referral:   Additional Screening:  Hepatitis C Screening: does not qualify; Completed 2023  Vision Screening: Recommended annual ophthalmology exams for early detection of glaucoma and other disorders of the eye. Is the patient up to date with their annual eye exam?  Yes  Who is the provider or what is the name of the office in which the patient attends annual eye exams? [facility] If pt is not established with a provider, would they like to be referred to a provider to establish care? No .   Dental Screening: Recommended annual dental exams for proper oral hygiene   Community Resource Referral / Chronic Care Management: CRR required this visit?  No   CCM  required this visit?  No     Plan:     I have personally reviewed and noted the following in the patient's chart:   Medical and social history Use of alcohol, tobacco or illicit drugs  Current medications and supplements including opioid prescriptions. Patient is not currently taking opioid prescriptions. Functional ability and status Nutritional status Physical activity Advanced directives List of other physicians Hospitalizations, surgeries, and ER visits in previous 12 months Vitals Screenings to include cognitive, depression, and falls Referrals and appointments  In addition, I have reviewed and discussed with patient certain preventive protocols, quality metrics, and best practice recommendations. A written personalized care plan for preventive services as well as general preventive health recommendations were provided to patient.     Mliss Graff, LPN   06/07/7973   After Visit Summary: (MyChart) Due to this being a telephonic visit, the after visit summary with patients personalized plan was offered to patient via MyChart   Nurse Notes:

## 2023-11-13 NOTE — Patient Instructions (Signed)
 Anne Alexander , Thank you for taking time to come for your Medicare Wellness Visit. I appreciate your ongoing commitment to your health goals. Please review the following plan we discussed and let me know if I can assist you in the future.   Screening recommendations/referrals: Colonoscopy: up to date Mammogram: up to date Bone Density: up to date Recommended yearly ophthalmology/optometry visit for glaucoma screening and checkup Recommended yearly dental visit for hygiene and checkup  Vaccinations: Influenza vaccine: up to date Pneumococcal vaccine:  Tdap vaccine: Education provided Shingles vaccine:     Advanced directives: Education provided     Preventive Care 65 Years and Older, Female Preventive care refers to lifestyle choices and visits with your health care provider that can promote health and wellness. What does preventive care include? A yearly physical exam. This is also called an annual well check. Dental exams once or twice a year. Routine eye exams. Ask your health care provider how often you should have your eyes checked. Personal lifestyle choices, including: Daily care of your teeth and gums. Regular physical activity. Eating a healthy diet. Avoiding tobacco and drug use. Limiting alcohol use. Practicing safe sex. Taking low-dose aspirin every day. Taking vitamin and mineral supplements as recommended by your health care provider. What happens during an annual well check? The services and screenings done by your health care provider during your annual well check will depend on your age, overall health, lifestyle risk factors, and family history of disease. Counseling  Your health care provider may ask you questions about your: Alcohol use. Tobacco use. Drug use. Emotional well-being. Home and relationship well-being. Sexual activity. Eating habits. History of falls. Memory and ability to understand (cognition). Work and work astronomer. Reproductive  health. Screening  You may have the following tests or measurements: Height, weight, and BMI. Blood pressure. Lipid and cholesterol levels. These may be checked every 5 years, or more frequently if you are over 72 years old. Skin check. Lung cancer screening. You may have this screening every year starting at age 62 if you have a 30-pack-year history of smoking and currently smoke or have quit within the past 15 years. Fecal occult blood test (FOBT) of the stool. You may have this test every year starting at age 52. Flexible sigmoidoscopy or colonoscopy. You may have a sigmoidoscopy every 5 years or a colonoscopy every 10 years starting at age 5. Hepatitis C blood test. Hepatitis B blood test. Sexually transmitted disease (STD) testing. Diabetes screening. This is done by checking your blood sugar (glucose) after you have not eaten for a while (fasting). You may have this done every 1-3 years. Bone density scan. This is done to screen for osteoporosis. You may have this done starting at age 82. Mammogram. This may be done every 1-2 years. Talk to your health care provider about how often you should have regular mammograms. Talk with your health care provider about your test results, treatment options, and if necessary, the need for more tests. Vaccines  Your health care provider may recommend certain vaccines, such as: Influenza vaccine. This is recommended every year. Tetanus, diphtheria, and acellular pertussis (Tdap, Td) vaccine. You may need a Td booster every 10 years. Zoster vaccine. You may need this after age 106. Pneumococcal 13-valent conjugate (PCV13) vaccine. One dose is recommended after age 29. Pneumococcal polysaccharide (PPSV23) vaccine. One dose is recommended after age 61. Talk to your health care provider about which screenings and vaccines you need and how often you need them.  This information is not intended to replace advice given to you by your health care provider.  Make sure you discuss any questions you have with your health care provider. Document Released: 11/19/2015 Document Revised: 07/12/2016 Document Reviewed: 08/24/2015 Elsevier Interactive Patient Education  2017 [employer].  Fall Prevention in the Home Falls can cause injuries. They can happen to people of all ages. There are many things you can do to make your home safe and to help prevent falls. What can I do on the outside of my home? Regularly fix the edges of walkways and driveways and fix any cracks. Remove anything that might make you trip as you walk through a door, such as a raised step or threshold. Trim any bushes or trees on the path to your home. Use bright outdoor lighting. Clear any walking paths of anything that might make someone trip, such as rocks or tools. Regularly check to see if handrails are loose or broken. Make sure that both sides of any steps have handrails. Any raised decks and porches should have guardrails on the edges. Have any leaves, snow, or ice cleared regularly. Use sand or salt on walking paths during winter. Clean up any spills in your garage right away. This includes oil or grease spills. What can I do in the bathroom? Use night lights. Install grab bars by the toilet and in the tub and shower. Do not use towel bars as grab bars. Use non-skid mats or decals in the tub or shower. If you need to sit down in the shower, use a plastic, non-slip stool. Keep the floor dry. Clean up any water that spills on the floor as soon as it happens. Remove soap buildup in the tub or shower regularly. Attach bath mats securely with double-sided non-slip rug tape. Do not have throw rugs and other things on the floor that can make you trip. What can I do in the bedroom? Use night lights. Make sure that you have a light by your bed that is easy to reach. Do not use any sheets or blankets that are too big for your bed. They should not hang down onto the floor. Have a  firm chair that has side arms. You can use this for support while you get dressed. Do not have throw rugs and other things on the floor that can make you trip. What can I do in the kitchen? Clean up any spills right away. Avoid walking on wet floors. Keep items that you use a lot in easy-to-reach places. If you need to reach something above you, use a strong step stool that has a grab bar. Keep electrical cords out of the way. Do not use floor polish or wax that makes floors slippery. If you must use wax, use non-skid floor wax. Do not have throw rugs and other things on the floor that can make you trip. What can I do with my stairs? Do not leave any items on the stairs. Make sure that there are handrails on both sides of the stairs and use them. Fix handrails that are broken or loose. Make sure that handrails are as long as the stairways. Check any carpeting to make sure that it is firmly attached to the stairs. Fix any carpet that is loose or worn. Avoid having throw rugs at the top or bottom of the stairs. If you do have throw rugs, attach them to the floor with carpet tape. Make sure that you have a light switch at the  top of the stairs and the bottom of the stairs. If you do not have them, ask someone to add them for you. What else can I do to help prevent falls? Wear shoes that: Do not have high heels. Have rubber bottoms. Are comfortable and fit you well. Are closed at the toe. Do not wear sandals. If you use a stepladder: Make sure that it is fully opened. Do not climb a closed stepladder. Make sure that both sides of the stepladder are locked into place. Ask someone to hold it for you, if possible. Clearly mark and make sure that you can see: Any grab bars or handrails. First and last steps. Where the edge of each step is. Use tools that help you move around (mobility aids) if they are needed. These include: Canes. Walkers. Scooters. Crutches. Turn on the lights when you  go into a dark area. Replace any light bulbs as soon as they burn out. Set up your furniture so you have a clear path. Avoid moving your furniture around. If any of your floors are uneven, fix them. If there are any pets around you, be aware of where they are. Review your medicines with your doctor. Some medicines can make you feel dizzy. This can increase your chance of falling. Ask your doctor what other things that you can do to help prevent falls. This information is not intended to replace advice given to you by your health care provider. Make sure you discuss any questions you have with your health care provider. Document Released: 08/19/2009 Document Revised: 03/30/2016 Document Reviewed: 11/27/2014 Elsevier Interactive Patient Education  2017 [employer].

## 2023-11-17 ENCOUNTER — Other Ambulatory Visit: Payer: Self-pay | Admitting: Family Medicine

## 2023-11-17 DIAGNOSIS — E039 Hypothyroidism, unspecified: Secondary | ICD-10-CM

## 2023-12-02 ENCOUNTER — Other Ambulatory Visit: Payer: Self-pay | Admitting: Family Medicine

## 2023-12-02 DIAGNOSIS — E785 Hyperlipidemia, unspecified: Secondary | ICD-10-CM

## 2023-12-22 IMAGING — MG MM DIGITAL SCREENING BILAT W/ TOMO AND CAD
6 of 10 series · 6 of 30 positions shown · non-contrast
Comparison: Previous exam(s).

CLINICAL DATA: Screening.

EXAM:
DIGITAL SCREENING BILATERAL MAMMOGRAM WITH TOMOSYNTHESIS AND CAD
TECHNIQUE: Bilateral screening digital craniocaudal and mediolateral oblique
mammograms were obtained. Bilateral screening digital breast
tomosynthesis was performed. The images were evaluated with
computer-aided detection.

[R MLO synth-2D (1 of 2)]
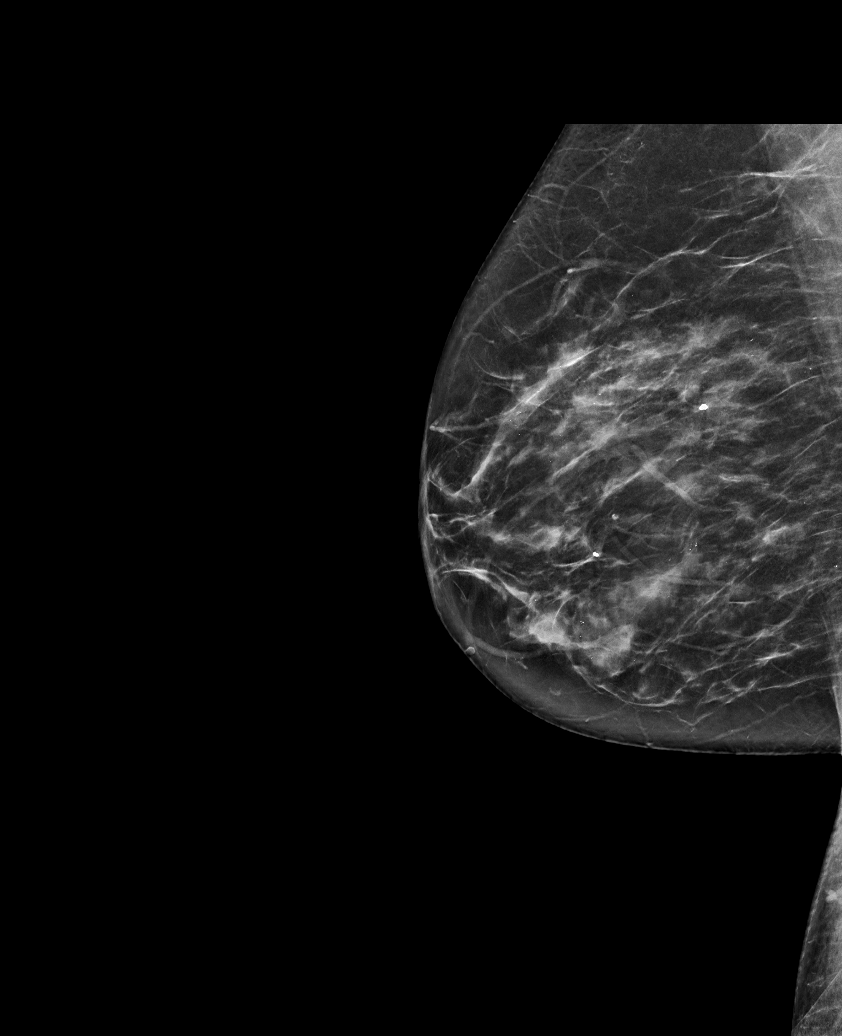

[L CC synth-2D]
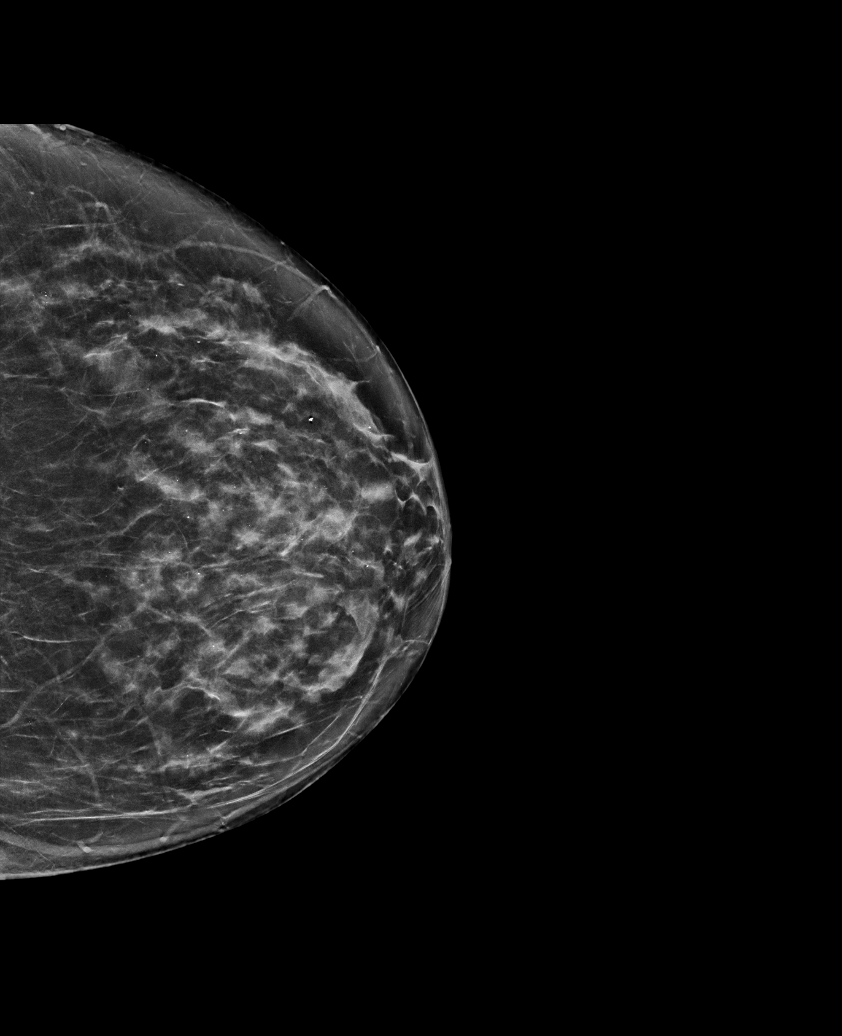

[R MLO synth-2D (2 of 2)]
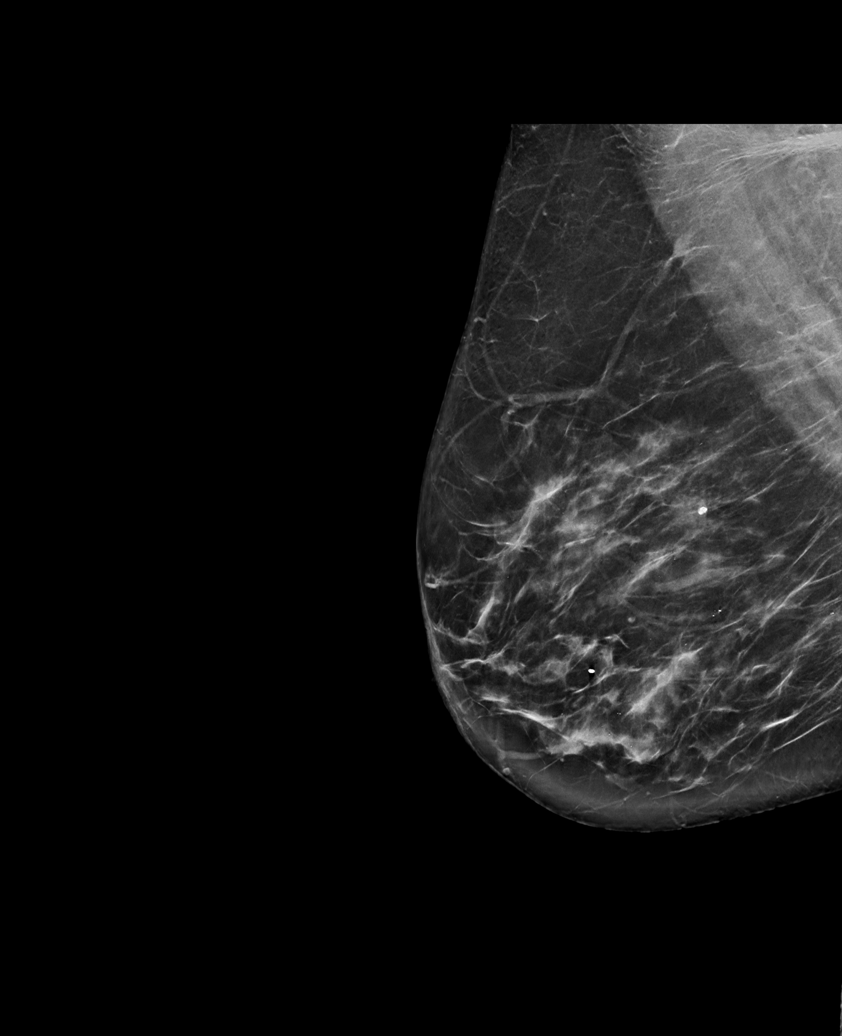

[L MLO synth-2D]
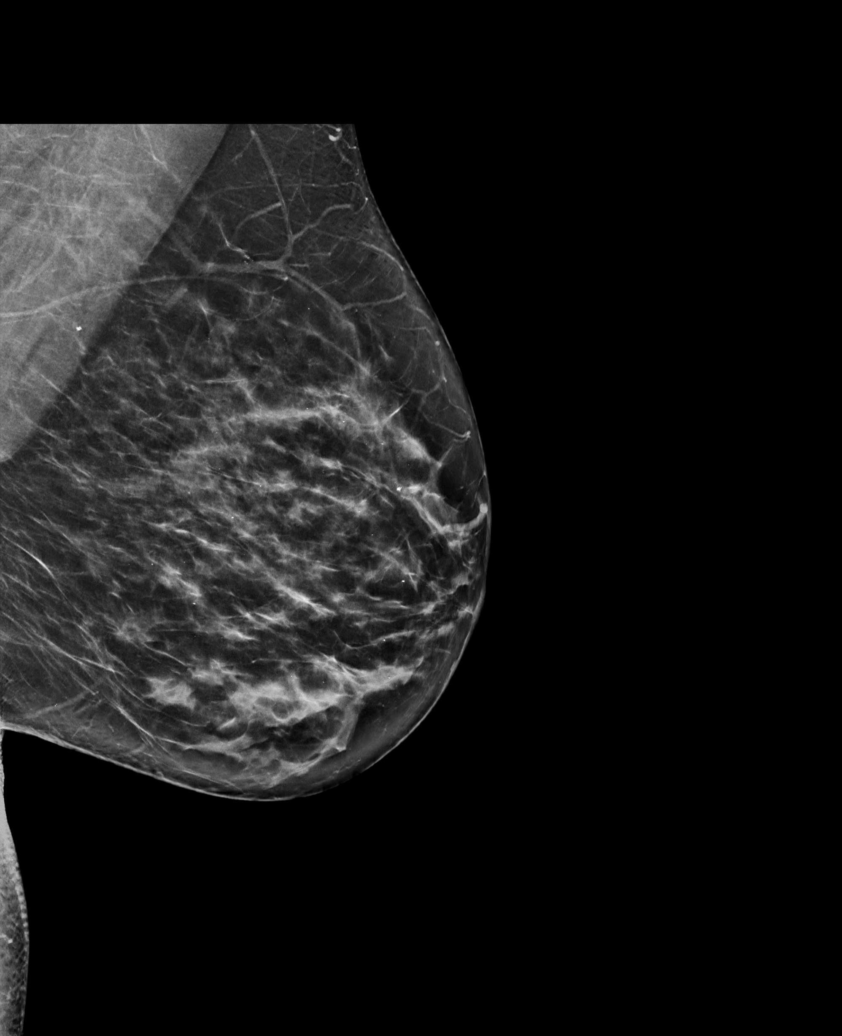

[R CC synth-2D]
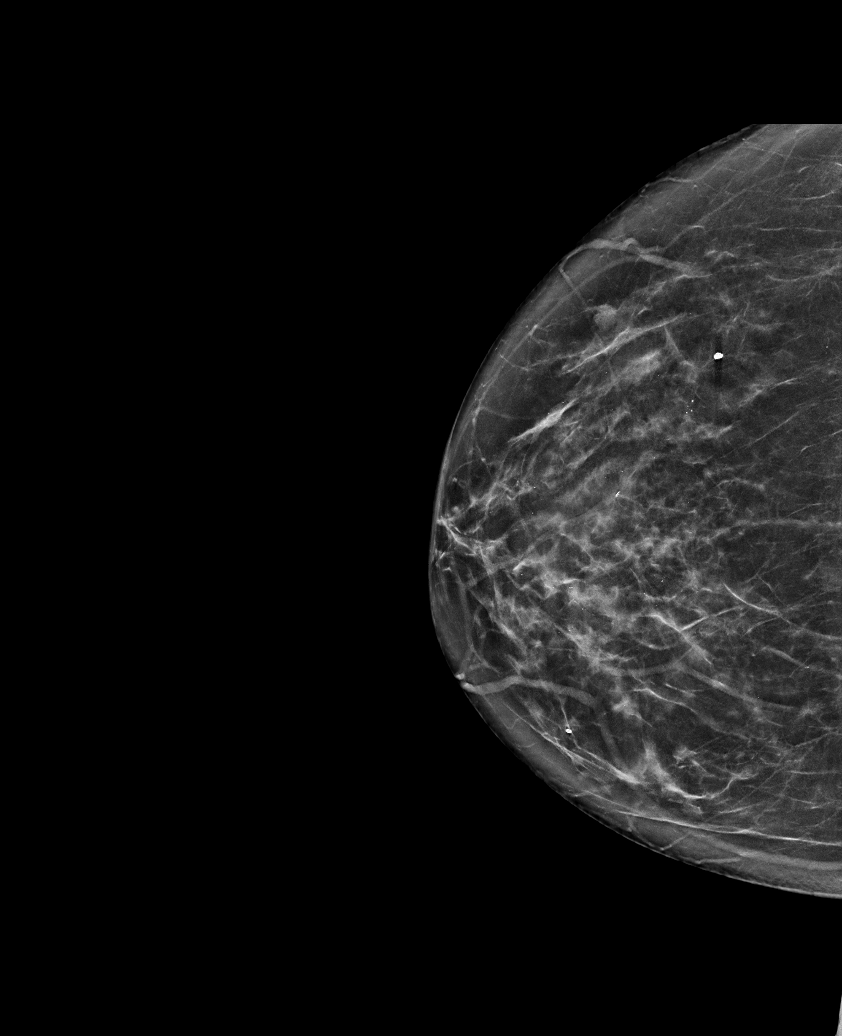

[R CC tomo · tomo slice 35/69.0]
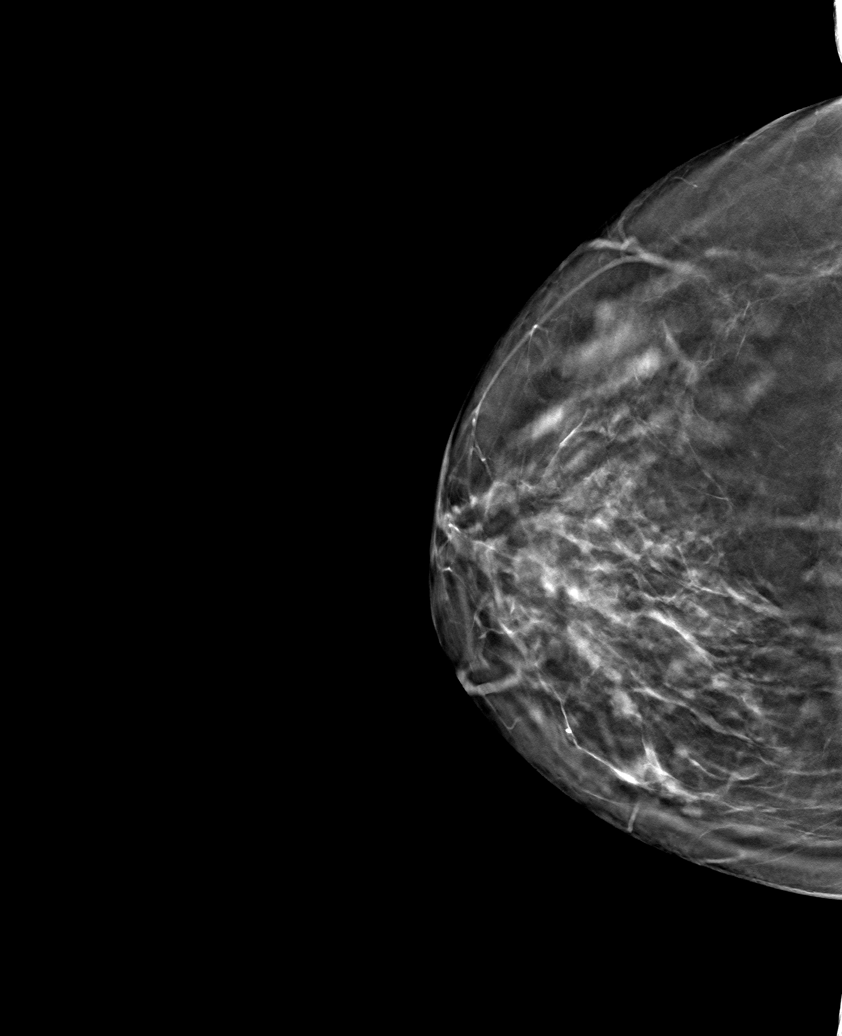

[6 of 30 positions shown; findings below may reference images not displayed]

ACR Breast Density Category c: The breast tissue is heterogeneously
dense, which may obscure small masses.
FINDINGS: There are no findings suspicious for malignancy.
IMPRESSION: No mammographic evidence of malignancy. A result letter of this
screening mammogram will be mailed directly to the patient.

RECOMMENDATION:
Screening mammogram in one year. (Code:Q3-W-BC3)

BI-RADS CATEGORY  1: Negative.

## 2024-01-01 ENCOUNTER — Other Ambulatory Visit: Payer: Self-pay | Admitting: Family Medicine

## 2024-01-01 DIAGNOSIS — E785 Hyperlipidemia, unspecified: Secondary | ICD-10-CM

## 2024-03-29 ENCOUNTER — Other Ambulatory Visit: Payer: Self-pay | Admitting: Family Medicine

## 2024-03-29 ENCOUNTER — Ambulatory Visit: Admission: EM | Admit: 2024-03-29 | Discharge: 2024-03-29 | Disposition: A | Discharge status: Home / Self Care

## 2024-03-29 DIAGNOSIS — L03011 Cellulitis of right finger: Secondary | ICD-10-CM

## 2024-03-29 DIAGNOSIS — E785 Hyperlipidemia, unspecified: Secondary | ICD-10-CM

## 2024-03-29 DIAGNOSIS — H60392 Other infective otitis externa, left ear: Secondary | ICD-10-CM

## 2024-03-29 MED ORDER — MUPIROCIN 2 % EX OINT: 1.0000 | TOPICAL_OINTMENT | Freq: Two times a day (BID) | CUTANEOUS | 3 refills | Status: DC

## 2024-03-29 MED ORDER — DOXYCYCLINE HYCLATE 100 MG PO CAPS: 100.0000 mg | ORAL_CAPSULE | Freq: Two times a day (BID) | ORAL | 0 refills | Status: AC

## 2024-03-29 NOTE — ED Provider Notes (Signed)
 [facility]    CSN: [id number] Arrival date & time: 03/29/24  0815      History   Chief Complaint Chief Complaint  Patient presents with   Ear Problem    HPI Anne Alexander is a 73 y.o. female.   73 year old female who presents to urgent care with 2 complaints, left external ear pain, swelling and redness as well as right middle finger pain, redness and swelling at the base of the nail.  The left external ear began hurting her yesterday with some itching.  She did notice a small amount of crusting on the area when she scratched it.  She woke up this morning with a very painful swollen and red.  She denies any fevers or chills associated with this.  She has not had any loss of hearing.  She has not had any recent illnesses.  She was working in her yard yesterday.  For her right middle finger she reports that this has been bothering her for about 2 weeks now with some pain and swelling at the base of the nail.  There is also been some redness.  She has not noted any drainage.  She has noted some changes to the medial aspect of the nail.  She has not had any injury to the area.  She denies any fevers or chills associated with this.        Past Medical History:  Diagnosis Date   Allergy    Cataract    forming very small    COVID 07/27/2020   felt bad fever fatigue loss of taste and smell x 10 days infusion given   Eczema    GERD (gastroesophageal reflux disease)    High cholesterol    History of hiatal hernia    Hypothyroidism    Memory change    Osteopenia 06/2018   T score -2.1 FRAX 14% / 0.3%   Rosacea    Sleep deficient    SUI (stress urinary incontinence, female)    Vaginal prolapse    Wears glasses     Patient Active Problem List   Diagnosis Date Noted   Genetic testing 03/02/2023   Family history of breast cancer 02/07/2023   Shoulder pain, left 01/05/2023   Physical exam 04/07/2022   Hypothyroidism 10/07/2021   Hot flashes 10/07/2021    Insomnia 10/07/2021   Hyperlipidemia 10/07/2021   Urinary frequency 10/07/2021   Memory change     Past Surgical History:  Procedure Laterality Date   ANTERIOR AND POSTERIOR REPAIR WITH SACROSPINOUS FIXATION N/A 01/03/2021   Procedure: SACROSPINOUS LIGAMENT FIXATION;  Surgeon: Arma Lamp, MD;  Location: [facility];  Service: Gynecology;  Laterality: N/A;  total time needed for all 4 procedures is 2 hours   BLADDER SUSPENSION  yrs ago   Raz bladder suspension   BLADDER SUSPENSION N/A 01/03/2021   Procedure: TRANSVAGINAL TAPE (TVT) PROCEDURE;  Surgeon: Arma Lamp, MD;  Location: [facility];  Service: Gynecology;  Laterality: N/A;   CATARACT EXTRACTION     colonscopy  2021   CYSTOSCOPY N/A 01/03/2021   Procedure: CYSTOSCOPY;  Surgeon: Arma Lamp, MD;  Location: [facility];  Service: Gynecology;  Laterality: N/A;   ingrown toenail removed  yrs ago   NECK SURGERY  6-7 yrs ago   cervical has plates and screws dr Lawson Prey   OOPHORECTOMY Bilateral age 83   endometriosis   RECTOCELE REPAIR  01/03/2021   Procedure: POSTERIOR REPAIR (  RECTOCELE);  Surgeon: Arma Lamp, MD;  Location: [facility];  Service: Gynecology;;   TONSILLECTOMY  as child   VAGINAL HYSTERECTOMY  age 28   partial then complete    OB History     Gravida  1   Para  1   Term  1   Preterm      AB  0   Living  1      SAB      IAB      Ectopic  0   Multiple      Live Births               Home Medications    Prior to Admission medications   Medication Sig Start Date End Date Taking? Authorizing Provider  acetaminophen  (TYLENOL ) 325 MG tablet Take 650 mg by mouth as needed for mild pain (pain score 1-3) or moderate pain (pain score 4-6).   Yes [provider]  diphenhydrAMINE  HCl, Sleep, (ZZZQUIL) 50 MG/30ML LIQD Take 30 mLs by mouth as needed.   Yes [provider]   doxycycline (VIBRAMYCIN) 100 MG capsule Take 1 capsule (100 mg total) by mouth 2 (two) times daily for 10 days. 03/29/24 04/08/24 Yes Farooq Petrovich A, PA-C  levothyroxine  (SYNTHROID ) 50 MCG tablet TAKE 1 TABLET BY MOUTH EVERY DAY 11/19/23  Yes Tabori, Katherine E, MD  mupirocin ointment (BACTROBAN) 2 % Apply 1 Application topically 2 (two) times daily. 03/29/24  Yes Aviya Jarvie A, PA-C  neomycin-bacitracin-polymyxin (NEOSPORIN) [phone] ointment Apply topically 4 (four) times daily.   Yes [provider]  PREVNAR 20 0.5 ML injection Inject 0.5 mLs into the muscle once. 12/03/23  Yes [provider]  rosuvastatin  (CRESTOR ) 10 MG tablet TAKE 1 TABLET BY MOUTH EVERY DAY 01/01/24  Yes Tabori, Katherine E, MD  AREXVY 120 MCG/0.5ML injection  08/09/23   [provider]  azithromycin  (ZITHROMAX ) 250 MG tablet 2 tabs on day 1, 1 tab on day 2-5 Patient not taking: Reported on 11/12/2023 09/14/23   Tabori, Katherine E, MD  cetirizine  (ZYRTEC ) 10 MG tablet Take 1 tablet (10 mg total) by mouth daily. 11/12/23   Tabori, Katherine E, MD  FLUZONE HIGH-DOSE 0.5 ML injection  08/09/23   [provider]  guaiFENesin -codeine  (ROBITUSSIN AC) 100-10 MG/5ML syrup Take 10 mLs by mouth 3 (three) times daily as needed for cough. 09/14/23   Tabori, Katherine E, MD  mometasone  (NASONEX ) 50 MCG/ACT nasal spray Place 2 sprays into the nose daily. 11/12/23   Jess Morita, MD  polyethylene glycol (MIRALAX ) 0.34 gm/ml SOLN  04/06/21   [provider]    Family History Family History  Problem Relation Age of Onset   Dementia Mother    Breast cancer Mother 89       bilateral   Cervical cancer Mother 28   Heart Problems Sister    Dementia Brother    Throat cancer Maternal Uncle 58   Lung cancer Maternal Uncle 38   Alzheimer's disease Maternal Grandfather    Breast cancer Cousin 92    Social History Social History   Tobacco Use   Smoking status: Former    Current  packs/day: 0.00    Average packs/day: 1 pack/day for 20.0 years (20.0 ttl pk-yrs)    Types: Cigarettes    Start date: 03/24/1986    Quit date: 03/24/2006    Years since quitting: 18.0   Smokeless tobacco: Never  Vaping Use   Vaping status: Never  Used  Substance Use Topics   Alcohol use: No   Drug use: No     Allergies   Penicillins   Review of Systems Review of Systems   Physical Exam Triage Vital Signs ED Triage Vitals  Encounter Vitals Group     BP 03/29/24 0903 (!) 149/68     Systolic BP Percentile --      Diastolic BP Percentile --      Pulse Rate 03/29/24 0903 63     Resp 03/29/24 0903 18     Temp 03/29/24 0903 98.1 F (36.7 C)     Temp Source 03/29/24 0903 Oral     SpO2 03/29/24 0903 97 %     Weight 03/29/24 0900 178 lb 8 oz (81 kg)     Height 03/29/24 0900 5' 5.5" (1.664 m)     Head Circumference --      Peak Flow --      Pain Score 03/29/24 0859 2     Pain Loc --      Pain Education --      Exclude from Growth Chart --    No data found.  Updated Vital Signs BP (!) 149/68 (BP Location: Right Arm)   Pulse 63   Temp 98.1 F (36.7 C) (Oral)   Resp 18   Ht 5' 5.5" (1.664 m)   Wt 178 lb 8 oz (81 kg)   SpO2 97%   BMI 29.25 kg/m   Visual Acuity Right Eye Distance:   Left Eye Distance:   Bilateral Distance:    Right Eye Near:   Left Eye Near:    Bilateral Near:     Physical Exam Vitals and nursing note reviewed.  Constitutional:      General: She is not in acute distress.    Appearance: She is well-developed.  HENT:     Head: Normocephalic and atraumatic.     Right Ear: Tympanic membrane and ear canal normal.     Left Ear: Tympanic membrane and ear canal normal.     Ears:     Comments: Left earlobe erythematous and mildly swelling, no fluctuance, tender to palpitation.  There is some erythema on the posterior auricular area as well.  The redness does not extend up the superior aspect of the ear.  There is no changes in the ear canal. Eyes:      Conjunctiva/sclera: Conjunctivae normal.  Cardiovascular:     Rate and Rhythm: Normal rate and regular rhythm.     Heart sounds: No murmur heard. Pulmonary:     Effort: Pulmonary effort is normal. No respiratory distress.     Breath sounds: Normal breath sounds.  Abdominal:     Palpations: Abdomen is soft.     Tenderness: There is no abdominal tenderness.  Musculoskeletal:        General: No swelling.     Right hand: Swelling (Middle finger at the base of the nail) and tenderness (Middle finger at the base of the nail) present. No bony tenderness. Normal range of motion. Normal strength. Normal capillary refill. Normal pulse.     Cervical back: Neck supple.     Comments: Right middle finger at the base of the nail with some mild erythema and swelling, no drainable area.  There is some changes on the medial aspect of the nail consistent with the nail lifting off the nail bed.  Skin:    General: Skin is warm and dry.     Capillary Refill: Capillary refill  takes less than 2 seconds.  Neurological:     Mental Status: She is alert.  Psychiatric:        Mood and Affect: Mood normal.      UC Treatments / Results  Labs (all labs ordered are listed, but only abnormal results are displayed) Labs Reviewed - No data to display  EKG   Radiology No results found.  Procedures Procedures (including critical care time)  Medications Ordered in UC Medications - No data to display  Initial Impression / Assessment and Plan / UC Course  I have reviewed the triage vital signs and the nursing notes.  Pertinent labs & imaging results that were available during my care of the patient were reviewed by me and considered in my medical decision making (see chart for details).     Infection of skin of ear lobe, left  Subungual infection of finger, right   Soft tissue infection of the left earlobe with surrounding erythema, consistent with worsening infection. Also, right middle finger  infection in the nail base. This does not need an incision and drainage but does need antibiotic treatment. We will treat both with the following:  Doxycycline 100 mg twice daily for 10 days. Take this with food.  Mupirocin ointment twice daily as needed to the right middle finger Monitor both areas and if any worsening (increased swelling, redness, fevers, pain), then return to urgent care.   Final Clinical Impressions(s) / UC Diagnoses   Final diagnoses:  Infection of skin of ear lobe, left  Subungual infection of finger, right     Discharge Instructions      Soft tissue infection of the left earlobe with surrounding erythema, consistent with worsening infection. Also, right middle finger infection in the nail base. This does not need an incision and drainage but does need antibiotic treatment. We will treat both with the following:  Doxycycline 100 mg twice daily for 10 days. Take this with food.  Mupirocin ointment twice daily as needed to the right middle finger Monitor both areas and if any worsening (increased swelling, redness, fevers, pain), then return to urgent care.    ED Prescriptions     Medication Sig Dispense Auth. Provider   doxycycline (VIBRAMYCIN) 100 MG capsule Take 1 capsule (100 mg total) by mouth 2 (two) times daily for 10 days. 20 capsule Geselle Cardosa A, PA-C   mupirocin ointment (BACTROBAN) 2 % Apply 1 Application topically 2 (two) times daily. 22 g Kreg Pesa, [place]      PDMP not reviewed this encounter.   Kreg Pesa, PA-C 03/29/24 1011

## 2024-03-29 NOTE — ED Triage Notes (Signed)
"  My left ear (on the outside of the ear) is red/inflamed (warm to touch) for unknown reason".   "Also, while I am here, my right middle finger (around nail) is red/infected". No fever.

## 2024-03-29 NOTE — Discharge Instructions (Addendum)
 Soft tissue infection of the left earlobe with surrounding erythema, consistent with worsening infection. Also, right middle finger infection in the nail base. This does not need an incision and drainage but does need antibiotic treatment. We will treat both with the following:  Doxycycline 100 mg twice daily for 10 days. Take this with food.  Mupirocin ointment twice daily as needed to the right middle finger Monitor both areas and if any worsening (increased swelling, redness, fevers, pain), then return to urgent care.

## 2024-04-11 ENCOUNTER — Encounter: Payer: PPO | Admitting: Family Medicine

## 2024-04-14 ENCOUNTER — Ambulatory Visit (INDEPENDENT_AMBULATORY_CARE_PROVIDER_SITE_OTHER): Admitting: Family Medicine

## 2024-04-14 ENCOUNTER — Encounter: Payer: Self-pay | Admitting: Family Medicine

## 2024-04-14 ENCOUNTER — Encounter: Admitting: Family Medicine

## 2024-04-14 VITALS — BP 110/68 | HR 60 | Temp 99.2°F | Ht 65.45 in | Wt 177.0 lb

## 2024-04-14 DIAGNOSIS — R200 Anesthesia of skin: Secondary | ICD-10-CM | POA: Diagnosis not present

## 2024-04-14 DIAGNOSIS — R202 Paresthesia of skin: Secondary | ICD-10-CM

## 2024-04-14 DIAGNOSIS — E785 Hyperlipidemia, unspecified: Secondary | ICD-10-CM

## 2024-04-14 DIAGNOSIS — Z0000 Encounter for general adult medical examination without abnormal findings: Secondary | ICD-10-CM | POA: Diagnosis not present

## 2024-04-14 NOTE — Patient Instructions (Addendum)
 Follow up in 4 months to recheck cholesterol We'll notify you of your lab results and make any changes if needed STOP the Rosuvastatin  (Crestor ) and see if muscle aches improve INCREASE your water intake to help w/ cramps ADD Gas-X or Phazyme to help w/ gas issues TRY 1mg  Melatonin 1 hr before desired bedtime Continue to work on healthy diet and regular exercise- you look great! Call with any questions or concerns Stay Safe!  Stay Healthy! Happy Bel[employer] Birthday!!!

## 2024-04-14 NOTE — Progress Notes (Unsigned)
   Subjective:    Patient ID: Anne Alexander, female    DOB: Jul 11, 1951, 73 y.o.   MRN: [id number]  HPI CPE- UTD on mammo, colonoscopy, PNA  Patient Care Team    Relationship Specialty Notifications Start End  Jess Morita, MD PCP - General Family Medicine  10/07/21     Health Maintenance  Topic Date Due   DTaP/Tdap/Td (1 - Tdap) Never done   Zoster Vaccines- Shingrix (2 of 2) 12/09/2020   COVID-19 Vaccine (8 - 2024-25 season) 08/29/2023   MAMMOGRAM  05/31/2024   INFLUENZA VACCINE  06/06/2024   Medicare Annual Wellness (AWV)  11/12/2024   Colonoscopy  06/22/2030   Pneumonia Vaccine 27+ Years old  Completed   DEXA SCAN  Completed   Hepatitis C Screening  Completed   HPV VACCINES  Aged Out   Meningococcal B Vaccine  Aged Out     Review of Systems Patient reports no vision/hearing changes, adenopathy, weight change,  persistant/recurrent hoarseness, swallowing issues, chest pain, palpitations, edema, persistant/recurrent cough, hemoptysis, dyspnea (rest/exertional/paroxysmal nocturnal), gastrointestinal bleeding (melena, rectal bleeding), abdominal pain, significant heartburn, bowel changes, GU symptoms (dysuria, hematuria, incontinence), Gyn symptoms (abnormal  bleeding, pain),  syncope, focal weakness, memory loss, skin/hair changes, abnormal bruising or bleeding, anxiety, or depression.   + hot flashes- pt reports she has had elevated temps during these episodes + gas + tingling of feet bilaterally- worsening over time + R middle finger nail is split and growing out + insomnia    Objective:   Physical Exam General Appearance:    Alert, cooperative, no distress, appears stated age  Head:    Normocephalic, without obvious abnormality, atraumatic  Eyes:    PERRL, conjunctiva/corneas clear, EOM's intact both eyes  Ears:    Normal TM's and external ear canals, both ears  Nose:   Nares normal, septum midline, mucosa normal, no drainage    or sinus tenderness  Throat:    Lips, mucosa, and tongue normal; teeth and gums normal  Neck:   Supple, symmetrical, trachea midline, no adenopathy;    Thyroid : no enlargement/tenderness/nodules  Back:     Symmetric, no curvature, ROM normal, no CVA tenderness  Lungs:     Clear to auscultation bilaterally, respirations unlabored  Chest Wall:    No tenderness or deformity   Heart:    Regular rate and rhythm, S1 and S2 normal, no murmur, rub   or gallop  Breast Exam:    Deferred to mammo  Abdomen:     Soft, non-tender, bowel sounds active all four quadrants,    no masses, no organomegaly  Genitalia:    Deferred  Rectal:    Extremities:   Extremities normal, atraumatic, no cyanosis or edema  Pulses:   2+ and symmetric all extremities  Skin:   Skin color, texture, turgor normal, no rashes or lesions  Lymph nodes:   Cervical, supraclavicular, and axillary nodes normal  Neurologic:   CNII-XII intact, normal strength, sensation and reflexes    throughout          Assessment & Plan:

## 2024-04-15 NOTE — Assessment & Plan Note (Signed)
 Pt's PE WNL w/ exception of R middle nail which is split and lifting from the base.  UTD on mammo, colonoscopy, PNA.  Check labs.  Anticipatory guidance provided.

## 2024-04-15 NOTE — Assessment & Plan Note (Signed)
 Maybe neuropathy of aging.  Check labs to r/o metabolic cause.

## 2024-04-21 ENCOUNTER — Other Ambulatory Visit: Payer: Self-pay

## 2024-04-21 ENCOUNTER — Ambulatory Visit: Payer: Self-pay | Admitting: Family Medicine

## 2024-04-21 DIAGNOSIS — E039 Hypothyroidism, unspecified: Secondary | ICD-10-CM

## 2024-04-21 MED ORDER — VITAMIN D (ERGOCALCIFEROL) 1.25 MG (50000 UNIT) PO CAPS: 50000.0000 [IU] | ORAL_CAPSULE | ORAL | 0 refills | Status: AC

## 2024-04-21 NOTE — Telephone Encounter (Signed)
-----   Message from Laymon Priest sent at 04/21/2024  7:59 AM EDT ----- B12 is low.  Please add a daily B12 supplement  Your Vit D is mildly low.  Based on this, we need to start 50,000 units weekly x12 weeks in addition to daily OTC supplement of at least 2000 units.   Your TSH is just a bit outside of range.  Rather than adjust your medication, we will repeat this at a lab only visit in 1 month (TSH, dx hypothyroid) and see if this goes back into range on its own.   If still abnormal at that time, we will make adjustments  Remainder of labs are stable and look great! ----- Message ----- From: Interface, Lab In Three Zero One Sent: 04/18/2024  12:39 PM EDT To: Jess Morita, MD

## 2024-05-12 ENCOUNTER — Encounter: Payer: Self-pay | Admitting: Family Medicine

## 2024-05-12 NOTE — Telephone Encounter (Signed)
 Patient asking about supplementation however on review of results I am unsure how to direct her.  She questions dose of B12 I see average OTC dose is 1,00 mcg tablet once daily -would this be correct?  Also asking what dose Calcium  she should take but I see her level was normal, no recent abnormal bone density and there is no direction to supplement this in result note.   And then she was concerned about her magnesium level which I do not see, patient is asking if this can be done with her follow up lab if this was not done?

## 2024-05-13 DIAGNOSIS — R5383 Other fatigue: Secondary | ICD-10-CM | POA: Insufficient documentation

## 2024-05-13 DIAGNOSIS — E7800 Pure hypercholesterolemia, unspecified: Secondary | ICD-10-CM | POA: Insufficient documentation

## 2024-05-13 DIAGNOSIS — K319 Disease of stomach and duodenum, unspecified: Secondary | ICD-10-CM | POA: Insufficient documentation

## 2024-05-13 DIAGNOSIS — E663 Overweight: Secondary | ICD-10-CM | POA: Insufficient documentation

## 2024-05-19 ENCOUNTER — Other Ambulatory Visit (INDEPENDENT_AMBULATORY_CARE_PROVIDER_SITE_OTHER)

## 2024-05-19 DIAGNOSIS — E039 Hypothyroidism, unspecified: Secondary | ICD-10-CM

## 2024-05-19 LAB — TSH: TSH: 0.58 u[IU]/mL (ref 0.35–5.50)

## 2024-05-20 ENCOUNTER — Ambulatory Visit: Payer: Self-pay | Admitting: Family Medicine

## 2024-05-20 NOTE — Telephone Encounter (Signed)
-----   Message from Comer Greet sent at 05/20/2024  1:47 PM EDT ----- TSH is now normal.  No changes at this time ----- Message ----- From: Interface, Lab In Three Zero One Sent: 05/19/2024   4:28 PM EDT To: Comer FORBES Greet, MD

## 2024-06-28 ENCOUNTER — Other Ambulatory Visit: Payer: Self-pay | Admitting: Family Medicine

## 2024-06-28 DIAGNOSIS — E785 Hyperlipidemia, unspecified: Secondary | ICD-10-CM

## 2024-07-08 NOTE — Telephone Encounter (Unsigned)
 Copied from CRM #[id number]. Topic: General - Other >> Jul 08, 2024  7:46 AM Robinson H wrote: Please call patient on her mobile number on file [phone] not house

## 2024-08-04 ENCOUNTER — Ambulatory Visit: Admitting: Family Medicine

## 2024-08-07 ENCOUNTER — Encounter: Payer: Self-pay | Admitting: Family Medicine

## 2024-08-07 ENCOUNTER — Ambulatory Visit: Admitting: Family Medicine

## 2024-08-07 VITALS — BP 130/70 | HR 57 | Temp 98.5°F | Wt 175.0 lb

## 2024-08-07 DIAGNOSIS — E785 Hyperlipidemia, unspecified: Secondary | ICD-10-CM | POA: Diagnosis not present

## 2024-08-07 DIAGNOSIS — G4700 Insomnia, unspecified: Secondary | ICD-10-CM | POA: Diagnosis not present

## 2024-08-07 LAB — HEPATIC FUNCTION PANEL
ALT: 12 U/L (ref 0–35)
AST: 18 U/L (ref 0–37)
Albumin: 4.3 g/dL (ref 3.5–5.2)
Alkaline Phosphatase: 62 U/L (ref 39–117)
Bilirubin, Direct: 0.1 mg/dL (ref 0.0–0.3)
Total Bilirubin: 0.4 mg/dL (ref 0.2–1.2)
Total Protein: 7.1 g/dL (ref 6.0–8.3)

## 2024-08-07 LAB — LIPID PANEL
Cholesterol: 212 mg/dL — ABNORMAL HIGH (ref 0–200)
HDL: 53.7 mg/dL (ref >39.00)
LDL Cholesterol: 133 mg/dL — ABNORMAL HIGH (ref 0–99)
NonHDL: 158.77
Total CHOL/HDL Ratio: 4
Triglycerides: 129 mg/dL (ref 0.0–149.0)
VLDL: 25.8 mg/dL (ref 0.0–40.0)

## 2024-08-07 LAB — CBC WITH DIFFERENTIAL/PLATELET
Basophils Absolute: 0 K/uL (ref 0.0–0.1)
Basophils Relative: 0.6 % (ref 0.0–3.0)
Eosinophils Absolute: 0.1 K/uL (ref 0.0–0.7)
Eosinophils Relative: 2.4 % (ref 0.0–5.0)
HCT: 39.5 % (ref 36.0–46.0)
Hemoglobin: 13.5 g/dL (ref 12.0–15.0)
Lymphocytes Relative: 22 % (ref 12.0–46.0)
Lymphs Abs: 1.2 K/uL (ref 0.7–4.0)
MCHC: 34.2 g/dL (ref 30.0–36.0)
MCV: 86.9 fl (ref 78.0–100.0)
Monocytes Absolute: 0.4 K/uL (ref 0.1–1.0)
Monocytes Relative: 7 % (ref 3.0–12.0)
Neutro Abs: 3.8 K/uL (ref 1.4–7.7)
Neutrophils Relative %: 68 % (ref 43.0–77.0)
Platelets: 292 K/uL (ref 150.0–400.0)
RBC: 4.55 Mil/uL (ref 3.87–5.11)
RDW: 13.4 % (ref 11.5–15.5)
WBC: 5.6 K/uL (ref 4.0–10.5)

## 2024-08-07 LAB — BASIC METABOLIC PANEL WITH GFR
BUN: 15 mg/dL (ref 6–23)
CO2: 28 meq/L (ref 19–32)
Calcium: 9.5 mg/dL (ref 8.4–10.5)
Chloride: 103 meq/L (ref 96–112)
Creatinine, Ser: 0.88 mg/dL (ref 0.40–1.20)
GFR: 66.15 mL/min (ref >60.00)
Glucose, Bld: 77 mg/dL (ref 70–99)
Potassium: 4 meq/L (ref 3.5–5.1)
Sodium: 137 meq/L (ref 135–145)

## 2024-08-07 LAB — TSH: TSH: 0.32 u[IU]/mL — ABNORMAL LOW (ref 0.35–5.50)

## 2024-08-07 NOTE — Assessment & Plan Note (Signed)
 Chronic problem.  Pt reports feeling better overall since stopping Crestor .  No longer having diffuse muscle aches.  Check labs and if medication is needed, will start Zetia due to statin intolerance.  Pt expressed understanding and is in agreement w/ plan.

## 2024-08-07 NOTE — Progress Notes (Signed)
   Subjective:    Patient ID: Anne Alexander, female    DOB: July 26, 1951, 73 y.o.   MRN: [id number]  HPI Hyperlipidemia- chronic problem.  Stopped Crestor  4 months ago due to reported muscle cramps.  Pt reports she is no longer 'hurting all over'  She is not interested in restarting Crestor .  Sleep issues- going to sleep between 8:30-9pm.  No trouble falling asleep.  She is up by 2-3am.  Previously tried Trazodone  but did not like the way that makes her feel.  Denies anxiety.  Will take Zquil for a few days and then take a medication holiday when she feels it's not as effective.   Review of Systems For ROS see HPI     Objective:   Physical Exam Vitals reviewed.  Constitutional:      General: She is not in acute distress.    Appearance: Normal appearance. She is well-developed. She is not ill-appearing.  HENT:     Head: Normocephalic and atraumatic.  Eyes:     Conjunctiva/sclera: Conjunctivae normal.     Pupils: Pupils are equal, round, and reactive to light.  Neck:     Thyroid : No thyromegaly.  Cardiovascular:     Rate and Rhythm: Normal rate and regular rhythm.     Pulses: Normal pulses.     Heart sounds: Normal heart sounds. No murmur heard. Pulmonary:     Effort: Pulmonary effort is normal. No respiratory distress.     Breath sounds: Normal breath sounds.  Abdominal:     General: There is no distension.     Palpations: Abdomen is soft.     Tenderness: There is no abdominal tenderness.  Musculoskeletal:     Cervical back: Normal range of motion and neck supple.     Right lower leg: No edema.     Left lower leg: No edema.  Lymphadenopathy:     Cervical: No cervical adenopathy.  Skin:    General: Skin is warm and dry.  Neurological:     General: No focal deficit present.     Mental Status: She is alert and oriented to person, place, and time.  Psychiatric:        Mood and Affect: Mood normal.        Behavior: Behavior normal.        Thought Content: Thought  content normal.           Assessment & Plan:

## 2024-08-07 NOTE — Assessment & Plan Note (Signed)
 Ongoing issue.  No problem falling asleep but difficulty staying asleep.  She is not interested in medication that could be habit forming.  Discussed Belsomra.  She is interested in doing some research before starting medication.  She will let me know what she decides.

## 2024-08-07 NOTE — Patient Instructions (Signed)
 Schedule your complete physical in 6 months We'll notify you of your lab results and make any changes if needed Keep up the good work on healthy diet and regular exercise- you look great! Consider Belsomra as a sleep medication Call with any questions or concerns Stay Safe!  Stay Healthy! Happy Fall!!

## 2024-08-08 ENCOUNTER — Ambulatory Visit: Payer: Self-pay | Admitting: Family Medicine

## 2024-08-08 ENCOUNTER — Other Ambulatory Visit: Payer: Self-pay

## 2024-08-08 MED ORDER — EZETIMIBE 10 MG PO TABS: 10.0000 mg | ORAL_TABLET | Freq: Every day | ORAL | 1 refills | Status: AC

## 2024-08-08 NOTE — Telephone Encounter (Signed)
 Patient replied with concerns and questions in regards to medication changes

## 2024-09-10 DIAGNOSIS — Z961 Presence of intraocular lens: Secondary | ICD-10-CM | POA: Diagnosis not present

## 2024-09-10 DIAGNOSIS — H5203 Hypermetropia, bilateral: Secondary | ICD-10-CM | POA: Diagnosis not present

## 2024-09-10 DIAGNOSIS — H04123 Dry eye syndrome of bilateral lacrimal glands: Secondary | ICD-10-CM | POA: Diagnosis not present

## 2024-09-30 ENCOUNTER — Telehealth: Payer: Self-pay

## 2024-09-30 NOTE — Telephone Encounter (Signed)
 Pt has appt with Dr.Vincent 10/01/2024

## 2024-09-30 NOTE — Telephone Encounter (Signed)
 Called patient and left vm to return call. Patient will need an appointment for sx. Please schedule if patient calls back

## 2024-09-30 NOTE — Telephone Encounter (Signed)
 Copied from CRM #[id number]. Topic: Clinical - Medication Question >> Sep 30, 2024  8:36 AM Revonda D wrote: Reason for CRM: Pt stated that she is experiencing dry throat and has yellow mucus. Pt wants to know if she can be prescribed an antibiotic before it gets worse. Pt stated that she is allergic to penicillin and would also like a callback with an update.

## 2024-10-01 ENCOUNTER — Ambulatory Visit (INDEPENDENT_AMBULATORY_CARE_PROVIDER_SITE_OTHER): Admitting: Student in an Organized Health Care Education/Training Program

## 2024-10-01 VITALS — BP 141/65 | HR 54 | Wt 172.0 lb

## 2024-10-01 DIAGNOSIS — J069 Acute upper respiratory infection, unspecified: Secondary | ICD-10-CM

## 2024-10-01 LAB — POC COVID19 BINAXNOW: SARS Coronavirus 2 Ag: NEGATIVE

## 2024-10-01 LAB — POCT INFLUENZA A/B
Influenza A, POC: NEGATIVE
Influenza B, POC: NEGATIVE

## 2024-10-01 MED ORDER — BENZONATATE 200 MG PO CAPS: 200.0000 mg | ORAL_CAPSULE | Freq: Two times a day (BID) | ORAL | 0 refills | Status: DC | PRN

## 2024-10-01 NOTE — Progress Notes (Signed)
 Acute Office Visit  Patient ID: Anne Alexander, female    DOB: 12/19/1950, 73 y.o.   MRN: [id number]  PCP: Mahlon Comer BRAVO, MD  Chief Complaint  Patient presents with   Cough    Dry cough and yellow mucus  Has been going on for several days  Patient has been taking zquil at night     Subjective:     HPI  Discussed the use of AI scribe software for clinical note transcription with the patient, who gave verbal consent to proceed.  History of Present Illness Anne Alexander is a 73 year old female who presents with a dry cough and sore throat.  She has been experiencing a dry cough and sore throat for the past three to four days. She is concerned about these symptoms due to recent exposure to children who were ill, specifically several two-year-olds and first graders with respiratory symptoms.  The cough is described as dry and not producing much mucus, more related to a sensation of postnasal drip. She notes a possible runny nose, which she attributes to wearing a mask. No ear pain, sinus pressure, or shortness of breath. She does not recall having a fever but felt warm at times. Eating and drinking well without difficulty.  She experiences body aches, which she attributes to physical activity, and mentions previous shoulder pain for which she received an injection that was effective.  She has had COVID-19 two or three times in the past.     Objective:    BP (!) 141/65   Pulse (!) 54   Wt 172 lb (78 kg)   SpO2 98%   BMI 28.23 kg/m   Physical Exam  Gen: Well-appearing woman Ears: Bilateral tympanic membranes are a little erythematous, but no middle ear effusions Face: No tenderness over the sinuses Mouth: Mildly erythematous posterior oropharynx without exudate Neck: No tender cervical adenopathy Heart: Regular, no murmur Lungs: Unlabored, clear in the upper lungs, minimal inspiratory crackles in the left base which improves after coughing   Results  for orders placed or performed in visit on 10/01/24  POCT Influenza A/B  Result Value Ref Range   Influenza A, POC Negative Negative   Influenza B, POC Negative Negative  POC COVID-19 BinaxNow  Result Value Ref Range   SARS Coronavirus 2 Ag Negative Negative       Assessment & Plan:   Problem List Items Addressed This Visit       Unprioritized   URTI (acute upper respiratory infection) - Primary   Symptoms indicate a viral respiratory infection with low suspicion for pneumonia or bacterial infection. COVID-19 and influenza were considered due to recent exposure, point-of-care testing was negative for both. Supportive care measures were advised.  I prescribed Tessalon  to use for cough suppression.  We talked about reasonable expectations of the postinfectious cough which can last for 2-4 weeks.  We talked about the safe use of NSAIDs and Tylenol .  We talked about reasonable measures to use to decrease the risk of transmission, which is a concern for her.        Relevant Orders   POCT Influenza A/B (Completed)   POC COVID-19 BinaxNow (Completed)    Meds ordered this encounter  Medications   benzonatate  (TESSALON ) 200 MG capsule    Sig: Take 1 capsule (200 mg total) by mouth 2 (two) times daily as needed for cough.    Dispense:  20 capsule    Refill:  0  Return if symptoms worsen or fail to improve.  Cleatus Debby Specking, MD [facility]

## 2024-10-01 NOTE — Assessment & Plan Note (Signed)
 Symptoms indicate a viral respiratory infection with low suspicion for pneumonia or bacterial infection. COVID-19 and influenza were considered due to recent exposure, point-of-care testing was negative for both. Supportive care measures were advised.  I prescribed Tessalon  to use for cough suppression.  We talked about reasonable expectations of the postinfectious cough which can last for 2-4 weeks.  We talked about the safe use of NSAIDs and Tylenol .  We talked about reasonable measures to use to decrease the risk of transmission, which is a concern for her.

## 2024-10-01 NOTE — Patient Instructions (Signed)
  VISIT SUMMARY: Today, you came in with a dry cough and sore throat that you've had for the past three to four days. You were concerned because you were recently around children who were sick. You also mentioned some body aches and a history of shoulder pain, which was previously treated with an injection.  YOUR PLAN: -VIRAL UPPER RESPIRATORY INFECTION: You have a viral upper respiratory infection, which is an infection of your upper airways caused by a virus. This is likely due to rhinovirus or parainfluenza, and there is a low chance of it being pneumonia or a bacterial infection. We performed a nasal swab to test for COVID-19 and influenza. For now, you should focus on supportive care, such as staying hydrated and resting. Please wear a mask at the funeral to reduce the risk of spreading the infection, especially to the elderly, and avoid attending if you develop a fever.  Instructions for Symptomatic Care of a Viral Upper Respiratory Infection  Most viral upper respiratory infections (colds) resolve on their own within 1-2 weeks. Antibiotics are not helpful and may cause harm. The following treatments can help relieve symptoms:   Acetaminophen  (Tylenol ): Use for fever, headache, or body aches. Adults and children 12 years and older: [phone] mg every 4-6 hours as needed, not to exceed 4,000 mg in 24 hours. Do not use with other products containing acetaminophen . Avoid alcohol while taking acetaminophen . Stop and seek medical help if you develop skin rash, redness, or blisters.   Naproxen (Aleve): Use for pain or fever. Adults: 220 mg every 8-12 hours as needed, not to exceed 660 mg in 24 hours. Take with food to reduce stomach upset. Do not use with other NSAIDs. Avoid in pregnancy after 30 weeks. Stop if you develop stomach pain, black stools, or signs of bleeding.   Dextromethorphan (Robitussin, Delsym): Use for bothersome cough. Adults and children 12 years and older: 10-20 mg every 4 hours,  or 30 mg every 6-8 hours, not to exceed 120 mg in 24 hours. Do not use if taking or recently stopped (within 2 weeks) a monoamine oxidase inhibitor (MAOI). Stop if cough lasts more than 7 days or is accompanied by fever, rash, or headache.   Fluticasone Nasal Spray (Flonase): Use for nasal congestion or runny nose. Adults and children 12 years and older: 2 sprays in each nostril once daily for up to 1 week, then 1-2 sprays daily as needed. Only use in the nose. Stop if you develop nosebleeds, persistent nasal pain, or symptoms do not improve in 7 days.   Benzonatate  (Tessalon ): Use for cough if other treatments are not effective. Adults and children over 10 years: 100-200 mg capsule up to three times daily (maximum 600 mg/day). Swallow capsules whole; do not chew, crush, or dissolve. Chewing can cause numbness of the mouth and choking. Keep out of reach of children--accidental ingestion can be fatal.  General Advice:  Rest, drink plenty of fluids, and use saline nasal sprays or rinses for comfort.  Symptoms may last up to 2 weeks. Seek medical attention if symptoms worsen, last longer than expected, or if you develop shortness of breath, chest pain, or high fever.

## 2024-10-14 ENCOUNTER — Telehealth: Payer: Self-pay | Admitting: Family Medicine

## 2024-10-14 NOTE — Telephone Encounter (Signed)
 GYN has been ordering her bone density tests and I see she has an upcoming appt w/ them in the new year.  She should call them to order this

## 2024-10-14 NOTE — Telephone Encounter (Signed)
 Pt need referral for [facility] for bone density scan

## 2024-10-14 NOTE — Telephone Encounter (Signed)
 Copied from CRM #[id number]. Topic: Appointments - Scheduling Inquiry for Clinic >> Oct 14, 2024  9:17 AM Victoria A wrote:   Reason for CRM: Patient called said she needs Bone Density Test scheduled [facility] [phone] Her last Bone Density was on 01/03/23 and next one needs to be on 01/04/25-please contact patient

## 2024-10-16 ENCOUNTER — Telehealth: Payer: Self-pay

## 2024-10-16 DIAGNOSIS — M8589 Other specified disorders of bone density and structure, multiple sites: Secondary | ICD-10-CM

## 2024-10-16 NOTE — Telephone Encounter (Signed)
 Patient called and stated that she needs a bone density order [facility]. She is aware I will send it to [facility] to order. Please place diagnosis & sign order for imaging to be done after 3/26.

## 2024-10-16 NOTE — Telephone Encounter (Signed)
 Signed.

## 2024-11-18 ENCOUNTER — Other Ambulatory Visit: Payer: Self-pay

## 2024-11-18 ENCOUNTER — Other Ambulatory Visit: Payer: Self-pay | Admitting: Family Medicine

## 2024-11-18 DIAGNOSIS — E039 Hypothyroidism, unspecified: Secondary | ICD-10-CM

## 2024-11-20 ENCOUNTER — Encounter: Payer: Self-pay | Admitting: Family Medicine

## 2024-11-21 ENCOUNTER — Other Ambulatory Visit (INDEPENDENT_AMBULATORY_CARE_PROVIDER_SITE_OTHER): Payer: Self-pay

## 2024-11-21 ENCOUNTER — Encounter: Payer: Self-pay | Admitting: Orthopedic Surgery

## 2024-11-21 ENCOUNTER — Ambulatory Visit: Admitting: Orthopedic Surgery

## 2024-11-21 DIAGNOSIS — M25511 Pain in right shoulder: Secondary | ICD-10-CM

## 2024-11-21 DIAGNOSIS — M7551 Bursitis of right shoulder: Secondary | ICD-10-CM | POA: Diagnosis not present

## 2024-11-21 NOTE — Progress Notes (Signed)
 "  Office Visit Note   Patient: Anne Alexander           Date of Birth: Feb 07, 1951           MRN: [id number] Visit Date: 11/21/2024 Requested by: Mahlon Comer BRAVO, MD [id number] [place],  [place] [address] PCP: Mahlon Comer BRAVO, MD  Subjective: Chief Complaint  Patient presents with   Right Shoulder - Pain    HPI: Anne Alexander is a 74 y.o. female who presents to the office reporting right shoulder pain.  Patient likes to do crafts and cooking.  Shoulder has been bothering her for several months.  Tylenol  helps.  Does hard for her to sleep on the right-hand side.  Last injection was October 2024 in the left shoulder..                ROS: All systems reviewed are negative as they relate to the chief complaint within the history of present illness.  Patient denies fevers or chills.  Assessment & Plan: Visit Diagnoses:  1. Right shoulder pain, unspecified chronicity     Plan: Impression is calcific tendinitis and AC joint arthritis of the right shoulder.  Based on the location of her pain which is primarily in the deltoid region I think this most likely represents symptomatic calcific tendinitis.  Plan is subacromial injection with 6-week return for decision at that time for against further imaging or AC joint ultrasound-guided injection.  Follow-Up Instructions: No follow-ups on file.   Orders:  Orders Placed This Encounter  Procedures   XR Shoulder Right   No orders of the defined types were placed in this encounter.     Procedures: Large Joint Inj: R subacromial bursa on 11/21/2024 5:43 PM Indications: diagnostic evaluation and pain Details: 18 G 1.5 in needle, posterior approach  Arthrogram: No  Medications: 9 mL bupivacaine  0.5 %; 5 mL lidocaine  1 %; 40 mg triamcinolone  acetonide 40 MG/ML Outcome: tolerated well, no immediate complications Procedure, treatment alternatives, risks and benefits explained, specific risks discussed. Consent was given by  the patient. Immediately prior to procedure a time out was called to verify the correct patient, procedure, equipment, support staff and site/side marked as required. Patient was prepped and draped in the usual sterile fashion.       Clinical Data: No additional findings.  Objective: Vital Signs: There were no vitals taken for this visit.  Physical Exam:  Constitutional: Patient appears well-developed HEENT:  Head: Normocephalic Eyes:EOM are normal Neck: Normal range of motion Cardiovascular: Normal rate Pulmonary/chest: Effort normal Neurologic: Patient is alert Skin: Skin is warm Psychiatric: Patient has normal mood and affect  Ortho Exam: Ortho exam demonstrates excellent rotator cuff strength in the right and left shoulders.  Mild AC joint tenderness on the right no AC joint tenderness on the left.  She has a little bit of popping that [facility] joint with crossarm adduction.  Passive range of motion is 70/110/170 with no coarse grinding or crepitus on the right-hand side with internal and external rotation at 90 degrees of abduction  Specialty Comments:  No specialty comments available.  Imaging: XR Shoulder Right Result Date: 11/21/2024 AP axillary outlet radiograph right shoulder reviewed.  Small focus of calcific tendinitis is present at the supraspinatus insertion.  Very rounded.  No glenohumeral joint arthritis.  Moderate AC joint arthritis is present.  Acromial distance maintained.  Visualized lung fields clear.  No fracture or dislocation.    PMFS History: Patient Active  Problem List   Diagnosis Date Noted   URTI (acute upper respiratory infection) 10/01/2024   Disorder of function of stomach 05/13/2024   Fatigue 05/13/2024   Overweight 05/13/2024   Pure hypercholesterolemia 05/13/2024   Numbness and tingling of both feet 04/14/2024   Genetic testing 03/02/2023   Family history of breast cancer 02/07/2023   Shoulder pain, left 01/05/2023   Physical exam 04/07/2022    Hypothyroidism 10/07/2021   Hot flashes 10/07/2021   Insomnia 10/07/2021   Hyperlipidemia 10/07/2021   Urinary frequency 10/07/2021   Memory change    Past Medical History:  Diagnosis Date   Allergy    Cataract    forming very small    COVID 07/27/2020   felt bad fever fatigue loss of taste and smell x 10 days infusion given   Eczema    GERD (gastroesophageal reflux disease)    High cholesterol    History of hiatal hernia    Hypothyroidism    Memory change    Osteopenia 06/2018   T score -2.1 FRAX 14% / 0.3%   Rosacea    Sleep deficient    SUI (stress urinary incontinence, female)    Vaginal prolapse    Wears glasses     Family History  Problem Relation Age of Onset   Dementia Mother    Breast cancer Mother 14       bilateral   Cervical cancer Mother 61   Heart Problems Sister    Dementia Brother    Throat cancer Maternal Uncle 89   Lung cancer Maternal Uncle 80   Alzheimer's disease Maternal Grandfather    Breast cancer Cousin 41    Past Surgical History:  Procedure Laterality Date   ANTERIOR AND POSTERIOR REPAIR WITH SACROSPINOUS FIXATION N/A 01/03/2021   Procedure: SACROSPINOUS LIGAMENT FIXATION;  Surgeon: Marilynne Rosaline SAILOR, MD;  Location: [facility];  Service: Gynecology;  Laterality: N/A;  total time needed for all 4 procedures is 2 hours   BLADDER SUSPENSION  yrs ago   Raz bladder suspension   BLADDER SUSPENSION N/A 01/03/2021   Procedure: TRANSVAGINAL TAPE (TVT) PROCEDURE;  Surgeon: Marilynne Rosaline SAILOR, MD;  Location: [facility];  Service: Gynecology;  Laterality: N/A;   CATARACT EXTRACTION     colonscopy  2021   CYSTOSCOPY N/A 01/03/2021   Procedure: CYSTOSCOPY;  Surgeon: Marilynne Rosaline SAILOR, MD;  Location: [facility];  Service: Gynecology;  Laterality: N/A;   ingrown toenail removed  yrs ago   NECK SURGERY  6-7 yrs ago   cervical has plates and screws dr mora   OOPHORECTOMY Bilateral age 59    endometriosis   RECTOCELE REPAIR  01/03/2021   Procedure: POSTERIOR REPAIR (RECTOCELE);  Surgeon: Marilynne Rosaline SAILOR, MD;  Location: [facility];  Service: Gynecology;;   TONSILLECTOMY  as child   VAGINAL HYSTERECTOMY  age 5   partial then complete   Social History   Occupational History    Employer: [employer]  Tobacco Use   Smoking status: Former    Current packs/day: 0.00    Average packs/day: 1 pack/day for 20.0 years (20.0 ttl pk-yrs)    Types: Cigarettes    Start date: 03/24/1986    Quit date: 03/24/2006    Years since quitting: 18.6   Smokeless tobacco: Never  Vaping Use   Vaping status: Never Used  Substance and Sexual Activity   Alcohol use: No   Drug use: No   Sexual activity:  Not Currently    Comment: Pt. declined sexual hx questions        "

## 2024-11-23 MED ORDER — LIDOCAINE HCL 1 % IJ SOLN
5.0000 mL | INTRAMUSCULAR | Status: AC | PRN
  Administered 2024-11-21: 5 mL

## 2024-11-23 MED ORDER — TRIAMCINOLONE ACETONIDE 40 MG/ML IJ SUSP
40.0000 mg | INTRAMUSCULAR | Status: AC | PRN
  Administered 2024-11-21: 40 mg via INTRA_ARTICULAR

## 2024-11-23 MED ORDER — BUPIVACAINE HCL 0.5 % IJ SOLN
9.0000 mL | INTRAMUSCULAR | Status: AC | PRN
  Administered 2024-11-21: 9 mL via INTRA_ARTICULAR

## 2025-01-01 ENCOUNTER — Encounter: Admitting: Nurse Practitioner

## 2025-01-02 ENCOUNTER — Ambulatory Visit: Admitting: Orthopedic Surgery

## 2025-01-08 ENCOUNTER — Encounter: Admitting: Nurse Practitioner

## 2025-02-05 ENCOUNTER — Other Ambulatory Visit (HOSPITAL_BASED_OUTPATIENT_CLINIC_OR_DEPARTMENT_OTHER)

## 2025-02-12 ENCOUNTER — Encounter: Admitting: Family Medicine
